# Patient Record
Sex: Female | Born: 1980 | Race: Black or African American | Hispanic: No | Marital: Single | Smoking: Never smoker
Health system: Southern US, Community
[De-identification: ages and names within clinical notes are randomized; demographics above are authoritative.]

## PROBLEM LIST (undated history)

## (undated) ENCOUNTER — Emergency Department (HOSPITAL_COMMUNITY): Payer: Medicaid Other

## (undated) DIAGNOSIS — M549 Dorsalgia, unspecified: Secondary | ICD-10-CM

## (undated) DIAGNOSIS — K219 Gastro-esophageal reflux disease without esophagitis: Secondary | ICD-10-CM

---

## 2008-08-15 ENCOUNTER — Ambulatory Visit: Payer: Self-pay | Admitting: Nurse Practitioner

## 2008-10-06 ENCOUNTER — Ambulatory Visit: Payer: Self-pay | Admitting: Internal Medicine

## 2009-03-23 ENCOUNTER — Ambulatory Visit: Payer: Self-pay | Admitting: Internal Medicine

## 2009-03-23 DIAGNOSIS — B9789 Other viral agents as the cause of diseases classified elsewhere: Secondary | ICD-10-CM | POA: Insufficient documentation

## 2009-07-11 ENCOUNTER — Ambulatory Visit: Payer: Self-pay | Admitting: Internal Medicine

## 2009-07-11 DIAGNOSIS — R5383 Other fatigue: Secondary | ICD-10-CM

## 2009-07-11 DIAGNOSIS — F411 Generalized anxiety disorder: Secondary | ICD-10-CM | POA: Insufficient documentation

## 2009-07-11 DIAGNOSIS — R5381 Other malaise: Secondary | ICD-10-CM

## 2009-07-11 DIAGNOSIS — G4700 Insomnia, unspecified: Secondary | ICD-10-CM

## 2009-07-13 LAB — CONVERTED CEMR LAB
Alkaline Phosphatase: 74 units/L (ref 39–117)
BUN: 8 mg/dL (ref 6–23)
Basophils Absolute: 0 10*3/uL (ref 0.0–0.1)
Basophils Relative: 1 % (ref 0–1)
CO2: 22 meq/L (ref 19–32)
Eosinophils Absolute: 0.1 10*3/uL (ref 0.0–0.7)
Eosinophils Relative: 1 % (ref 0–5)
Glucose, Bld: 83 mg/dL (ref 70–99)
HCT: 34.7 % — ABNORMAL LOW (ref 36.0–46.0)
Hemoglobin: 10.2 g/dL — ABNORMAL LOW (ref 12.0–15.0)
MCHC: 29.4 g/dL — ABNORMAL LOW (ref 30.0–36.0)
MCV: 71.5 fL — ABNORMAL LOW (ref 78.0–100.0)
Monocytes Absolute: 0.3 10*3/uL (ref 0.1–1.0)
Monocytes Relative: 7 % (ref 3–12)
Neutro Abs: 2 10*3/uL (ref 1.7–7.7)
RBC: 4.85 M/uL (ref 3.87–5.11)
RDW: 17.5 % — ABNORMAL HIGH (ref 11.5–15.5)
Sodium: 140 meq/L (ref 135–145)
TSH: 2.072 microintl units/mL (ref 0.350–4.500)
Total Bilirubin: 0.4 mg/dL (ref 0.3–1.2)
Total Protein: 7.1 g/dL (ref 6.0–8.3)

## 2009-07-16 ENCOUNTER — Encounter (INDEPENDENT_AMBULATORY_CARE_PROVIDER_SITE_OTHER): Payer: Self-pay | Admitting: Internal Medicine

## 2009-08-02 DIAGNOSIS — D509 Iron deficiency anemia, unspecified: Secondary | ICD-10-CM | POA: Insufficient documentation

## 2009-08-02 LAB — CONVERTED CEMR LAB
Iron: 20 ug/dL — ABNORMAL LOW (ref 42–145)
Saturation Ratios: 4 % — ABNORMAL LOW (ref 20–55)
UIBC: 449 ug/dL

## 2009-08-03 ENCOUNTER — Ambulatory Visit: Payer: Self-pay | Admitting: Internal Medicine

## 2009-08-03 DIAGNOSIS — L259 Unspecified contact dermatitis, unspecified cause: Secondary | ICD-10-CM | POA: Insufficient documentation

## 2009-09-20 ENCOUNTER — Ambulatory Visit: Payer: Self-pay | Admitting: Internal Medicine

## 2009-09-20 DIAGNOSIS — B86 Scabies: Secondary | ICD-10-CM | POA: Insufficient documentation

## 2010-05-29 ENCOUNTER — Ambulatory Visit: Payer: Self-pay | Admitting: Internal Medicine

## 2010-05-29 DIAGNOSIS — J309 Allergic rhinitis, unspecified: Secondary | ICD-10-CM | POA: Insufficient documentation

## 2010-08-26 ENCOUNTER — Ambulatory Visit: Payer: Self-pay | Admitting: Nurse Practitioner

## 2010-08-26 DIAGNOSIS — K029 Dental caries, unspecified: Secondary | ICD-10-CM | POA: Insufficient documentation

## 2011-01-07 NOTE — Assessment & Plan Note (Signed)
Summary: Acute - Allergic Rhinitis/Dental problems   Vital Signs:  Patient profile:   30 year old female LMP:     08/01/2010 Weight:      273.5 pounds BMI:     41.74 Pulse rate:   72 / minute Pulse rhythm:   regular Resp:     16 per minute BP sitting:   120 / 70  (left arm) Cuff size:   large  Vitals Entered By: Levon Hedger (August 26, 2010 4:18 PM) CC: pain in teeth with headache....back pain Is Patient Diabetic? No Pain Assessment Patient in pain? yes       Does patient need assistance? Functional Status Self care Ambulation Normal LMP (date): 08/01/2010     Enter LMP: 08/01/2010   CC:  pain in teeth with headache....back pain.  History of Present Illness:  Pt into the office with several complaints of pain. Appears to have vague pain symptoms during previous visits.  Tooth pain - left upper molar.  Pain started 3 day ago.  Last dental visit 1 year ago Admits to some swelling in jaw  She took tylenol otc which helps some with pain. Headache started prior to onset of dental problems - frontal  pain described as throbbing which is alleviated with tylenol -nausea or vomiting -no photophobia -blurred vision  Allergy meds prescribed in June 2011 and pt took for 2 weeks then she stopped because the symptoms improved.  Later when questioned about the medication pt reports that she has finished both.  Advised pt that she has refills and she should go to the refills as ordered.  Allergies (verified): No Known Drug Allergies  Review of Systems General:  Complains of fatigue, fever, and weakness. ENT:  Denies earache and sore throat. CV:  Denies chest pain or discomfort. Resp:  Denies cough. GI:  Denies abdominal pain, nausea, and vomiting. MS:  Complains of joint pain; bil knee pain. Neuro:  Complains of headaches.  Physical Exam  General:  alert.   Head:  normocephalic.   Ears:  bil l TM with clear fluid present no erythema Mouth:  left upper -  dental cavity Lungs:  normal breath sounds.   Heart:  normal rate and regular rhythm.   Abdomen:  normal bowel sounds.   Msk:  up to the exam table Neurologic:  alert & oriented X3.     Impression & Recommendations:  Problem # 1:  DENTAL CARIES (ICD-521.00) advised pt to make dental appt wil treat with pen v k  Problem # 2:  ALLERGIC RHINITIS (ICD-477.9) advised pt to take medications as ordered Her updated medication list for this problem includes:    Fluticasone Propionate 50 Mcg/act Susp (Fluticasone propionate) .Marland Kitchen... 2 sprays each nostril daily    Cetirizine Hcl 10 Mg Tabs (Cetirizine hcl) .Marland Kitchen... 1 tab by mouth daily for allergies  Complete Medication List: 1)  Ibuprofen 800 Mg Tabs (Ibuprofen) .... 1/2 to 1 tab by mouth every 6 hours as needed for headache, fever, body aches, sore throat 2)  Ferrous Sulfate 325 (65 Fe) Mg Tabs (Ferrous sulfate) .Marland Kitchen.. 1 tab by mouth two times a day 3)  Clobetasol Propionate 0.05 % Crea (Clobetasol propionate) .... Apply two times a day to affected areas prn 4)  Fluticasone Propionate 50 Mcg/act Susp (Fluticasone propionate) .... 2 sprays each nostril daily 5)  Cetirizine Hcl 10 Mg Tabs (Cetirizine hcl) .Marland Kitchen.. 1 tab by mouth daily for allergies 6)  Penicillin V Potassium 500 Mg Tabs (Penicillin v potassium) .Marland KitchenMarland KitchenMarland Kitchen  One tablet by mouth three times a day for infection  Patient Instructions: 1)  Allergies - this is likely the cause for headache. 2)  Take your allergy medications as ordered.  You have refills at the pharmacy 3)  Tooth pain - You to schedule an appointment with the dentist.  Take Penicillin three times a day WITH FOOD 4)  Follow up as needed with Dr. Delrae Alfred. Prescriptions: IBUPROFEN 800 MG TABS (IBUPROFEN) 1/2 to 1 tab by mouth every 6 hours as needed for headache, fever, body aches, sore throat  #50 x 1   Entered and Authorized by:   Lehman Prom FNP   Signed by:   Lehman Prom FNP on 08/26/2010   Method used:   Print then Mail  to Patient   RxID:   [id number] PENICILLIN V POTASSIUM 500 MG TABS (PENICILLIN V POTASSIUM) One tablet by mouth three times a day for infection  #30 x 0   Entered and Authorized by:   Lehman Prom FNP   Signed by:   Lehman Prom FNP on 08/26/2010   Method used:   Print then Mail to Patient   RxID:   [id number]

## 2011-01-07 NOTE — Assessment & Plan Note (Signed)
Summary: headache/ cough/gk   Vital Signs:  Patient profile:   30 year old female Weight:      272 pounds BMI:     41.51 Temp:     98.5 degrees F Pulse rate:   66 / minute Pulse rhythm:   regular Resp:     18 per minute BP sitting:   117 / 79  (left arm) Cuff size:   large  Vitals Entered By: Vesta Mixer CMA (May 29, 2010 11:04 AM) CC: cough, HA and hoarseness for about 4 weeks. Is Patient Diabetic? No Pain Assessment Patient in pain? no       Does patient need assistance? Ambulation Normal   CC:  cough and HA and hoarseness for about 4 weeks.Marland Kitchen  History of Present Illness:  1.  Started with a cough 4 weeks ago--comes and goes. Productive of white mucous  Gets hoarseness after coughing for a couple of days.  Has itchy throat.  Did have sneezing originally, but that is not bothering her now.  No nasal drainage.  Eyes intermittently itching and watering.  Does not not symptoms worsening when goes outside.  Has windows of home open to outside.  No bad taste from posterior pharyngeal drainage.  Having frontal headaches and pain around eyes.  Ears at sometimes feel stuffy.  Has not taken any meds otc.  Allergies (verified): No Known Drug Allergies  Physical Exam  Eyes:  No corneal or conjunctival inflammation noted. EOMI. Perrla. Funduscopic exam benign, without hemorrhages, exudates or papilledema. Vision grossly normal. Ears:  External ear exam shows no significant lesions or deformities.  Otoscopic examination reveals clear canals, tympanic membranes are intact bilaterally without bulging, retraction, inflammation or discharge. Hearing is grossly normal bilaterally. Nose:  nasal dischargemucosal pallor.   Mouth:  pharynx pink and moist.   Neck:  No deformities, masses, or tenderness noted. Lungs:  Normal respiratory effort, chest expands symmetrically. Lungs are clear to auscultation, no crackles or wheezes. Heart:  Normal rate and regular rhythm. S1 and S2 normal without  gallop, murmur, click, rub or other extra sounds.   Impression & Recommendations:  Problem # 1:  ALLERGIC RHINITIS WITH CONJUNCTIVITIS (ICD-477.9) Start meds--call if no improvement in 2 weeks Her updated medication list for this problem includes:    Fluticasone Propionate 50 Mcg/act Susp (Fluticasone propionate) .Marland Kitchen... 2 sprays each nostril daily    Cetirizine Hcl 10 Mg Tabs (Cetirizine hcl) .Marland Kitchen... 1 tab by mouth daily for allergies  Complete Medication List: 1)  Ibuprofen 800 Mg Tabs (Ibuprofen) .... 1/2 to 1 tab by mouth every 6 hours as needed for headache, fever, body aches, sore throat 2)  Ferrous Sulfate 325 (65 Fe) Mg Tabs (Ferrous sulfate) .Marland Kitchen.. 1 tab by mouth two times a day 3)  Clobetasol Propionate 0.05 % Crea (Clobetasol propionate) .... Apply two times a day to affected areas prn 4)  Fluticasone Propionate 50 Mcg/act Susp (Fluticasone propionate) .... 2 sprays each nostril daily 5)  Cetirizine Hcl 10 Mg Tabs (Cetirizine hcl) .Marland Kitchen.. 1 tab by mouth daily for allergies  Patient Instructions: 1)  Call in 2 weeks if not improved Prescriptions: CETIRIZINE HCL 10 MG TABS (CETIRIZINE HCL) 1 tab by mouth daily for allergies  #30 x 11   Entered and Authorized by:   Julieanne Manson MD   Signed by:   Julieanne Manson MD on 05/29/2010   Method used:   Electronically to        [facility] [phone]* (retail)  [address]       [place], [place]  [address]       Ph: [phone]       Fax: [phone]   RxID:   [id number] FLUTICASONE PROPIONATE 50 MCG/ACT SUSP (FLUTICASONE PROPIONATE) 2 sprays each nostril daily  #1 x 11   Entered and Authorized by:   Julieanne Manson MD   Signed by:   Julieanne Manson MD on 05/29/2010   Method used:   Electronically to        [facility] [phone]* (retail)       [address]       [place], [place]  [address]       Ph: [phone]       Fax: [phone]   RxID:   [id number]

## 2012-07-15 ENCOUNTER — Encounter (HOSPITAL_COMMUNITY): Payer: Self-pay | Admitting: Adult Health

## 2012-07-15 ENCOUNTER — Emergency Department (HOSPITAL_COMMUNITY)
Admission: EM | Admit: 2012-07-15 | Discharge: 2012-07-15 | Disposition: A | Discharge status: Home / Self Care | Payer: Medicaid Other | Attending: Emergency Medicine | Admitting: Emergency Medicine

## 2012-07-15 DIAGNOSIS — G245 Blepharospasm: Secondary | ICD-10-CM

## 2012-07-15 DIAGNOSIS — R259 Unspecified abnormal involuntary movements: Secondary | ICD-10-CM | POA: Insufficient documentation

## 2012-07-15 DIAGNOSIS — R51 Headache: Secondary | ICD-10-CM | POA: Insufficient documentation

## 2012-07-15 MED ORDER — ISOMETHEPTENE-APAP-DICHLORAL 65-325-100 MG PO CAPS: 1.0000 | ORAL_CAPSULE | Freq: Four times a day (QID) | ORAL | Status: DC | PRN

## 2012-07-15 MED ORDER — DIAZEPAM 5 MG PO TABS: 5.0000 mg | ORAL_TABLET | Freq: Four times a day (QID) | ORAL | Status: AC | PRN

## 2012-07-15 NOTE — ED Notes (Signed)
Pt reports she has a problem with her left lower eye, no swelling or drainage visible. Pt denies change in vision. Pt reports it has given her a headache and made her not feel well.

## 2012-07-15 NOTE — ED Notes (Signed)
C/o left eye twitching and pain that causes headache, began on Tuesday. No redness or edema, twitching noted.

## 2012-07-15 NOTE — ED Provider Notes (Signed)
History  Scribed for Celene Kras, MD, the patient was seen in room TR06C/TR06C. This chart was scribed by Candelaria Stagers. The patient's care started at 5:12 PM   CSN: [id number]  Arrival date & time 07/15/12  1553   First MD Initiated Contact with Patient 07/15/12 1702      Chief Complaint  Patient presents with  . Eye Pain     The history is provided by the patient.   Kathleen Fletcher is a 31 y.o. female who presents to the Emergency Department complaining of her left eye twitching which started three days ago.  Pt is also experiencing vomiting, nausea, and headache.  She reports that she has not seen her PCP for these sx.  Pt denies any caffeine intake lately or new stress.  She reports that she has never experienced these sx before.    History reviewed. No pertinent past medical history.  History reviewed. No pertinent past surgical history.  History reviewed. No pertinent family history.  History  Substance Use Topics  . Smoking status: Never Smoker   . Smokeless tobacco: Not on file  . Alcohol Use: No    OB History    Grav Para Term Preterm Abortions TAB SAB Ect Mult Living                  Review of Systems  Constitutional: Negative for fever.       10 Systems reviewed and are negative for acute change except as noted in the HPI.  HENT: Negative for congestion.   Eyes: Negative for discharge and redness.       Left eye twitching  Respiratory: Negative for cough and shortness of breath.   Cardiovascular: Negative for chest pain.  Gastrointestinal: Positive for nausea and vomiting. Negative for abdominal pain.  Musculoskeletal: Negative for back pain.  Skin: Negative for rash.  Neurological: Positive for headaches. Negative for syncope and numbness.  Psychiatric/Behavioral:       No behavior change.    Allergies  Review of patient's allergies indicates no known allergies.  Home Medications   Current Outpatient Rx  Name Route Sig Dispense Refill  .  IBUPROFEN 200 MG PO TABS Oral Take 400 mg by mouth every 6 (six) hours as needed. For headache      BP 119/70  Pulse 76  Temp 98.9 F (37.2 C) (Oral)  Resp 18  SpO2 100%  Physical Exam  Nursing note and vitals reviewed. Constitutional: She is oriented to person, place, and time. She appears well-developed and well-nourished. No distress.  HENT:  Head: Normocephalic and atraumatic.  Right Ear: External ear normal.  Left Ear: External ear normal.  Eyes: Conjunctivae are normal. Right eye exhibits no discharge. Left eye exhibits no discharge. No scleral icterus.       Recurrent blepharospasm left  Neck: Neck supple. No tracheal deviation present.  Cardiovascular: Normal rate, regular rhythm and intact distal pulses.   Pulmonary/Chest: Effort normal and breath sounds normal. No stridor. No respiratory distress. She has no wheezes. She has no rales.  Abdominal: Soft. Bowel sounds are normal. She exhibits no distension. There is no tenderness. There is no rebound and no guarding.  Musculoskeletal: She exhibits no edema and no tenderness.  Neurological: She is alert and oriented to person, place, and time. She has normal strength. No cranial nerve deficit ( no gross defecits noted) or sensory deficit. She exhibits normal muscle tone. She displays no seizure activity. Coordination and gait normal.  Skin: Skin  is warm and dry. No rash noted.  Psychiatric: She has a normal mood and affect.    ED Course  Procedures  DIAGNOSTIC STUDIES: Oxygen Saturation is 100% on room air, normal by my interpretation.    COORDINATION OF CARE:     Labs Reviewed - No data to display No results found.   1. Eye twitch   2. Headache       MDM  Patient does not have any evidence of neurologic dysfunction. She is having recurrent blepharospasm. She denies any recent stress or anxiety. She has not been taking excessive caffeinated beverages.  I will discharge her home on pain muscle relaxant and  migraine type medication. She is encouraged to follow up with her primary Dr. if the symptoms are not getting better I personally performed the services described in this documentation, which was scribed in my presence.  The recorded information has been reviewed and considered.         Celene Kras, MD 07/15/12 [phone]

## 2013-02-13 ENCOUNTER — Emergency Department (HOSPITAL_COMMUNITY)
Admission: EM | Admit: 2013-02-13 | Discharge: 2013-02-13 | Disposition: A | Discharge status: Home / Self Care | Payer: Medicaid Other | Attending: Emergency Medicine | Admitting: Emergency Medicine

## 2013-02-13 ENCOUNTER — Encounter (HOSPITAL_COMMUNITY): Payer: Self-pay | Admitting: Emergency Medicine

## 2013-02-13 DIAGNOSIS — R509 Fever, unspecified: Secondary | ICD-10-CM | POA: Insufficient documentation

## 2013-02-13 DIAGNOSIS — B9789 Other viral agents as the cause of diseases classified elsewhere: Secondary | ICD-10-CM | POA: Insufficient documentation

## 2013-02-13 DIAGNOSIS — Z3202 Encounter for pregnancy test, result negative: Secondary | ICD-10-CM | POA: Insufficient documentation

## 2013-02-13 DIAGNOSIS — R112 Nausea with vomiting, unspecified: Secondary | ICD-10-CM | POA: Insufficient documentation

## 2013-02-13 DIAGNOSIS — R1110 Vomiting, unspecified: Secondary | ICD-10-CM

## 2013-02-13 DIAGNOSIS — B349 Viral infection, unspecified: Secondary | ICD-10-CM

## 2013-02-13 DIAGNOSIS — IMO0001 Reserved for inherently not codable concepts without codable children: Secondary | ICD-10-CM | POA: Insufficient documentation

## 2013-02-13 LAB — POCT PREGNANCY, URINE: Preg Test, Ur: NEGATIVE

## 2013-02-13 LAB — CBC WITH DIFFERENTIAL/PLATELET
Basophils Relative: 0 % (ref 0–1)
Eosinophils Absolute: 0 10*3/uL (ref 0.0–0.7)
Eosinophils Relative: 0 % (ref 0–5)
Hemoglobin: 11.7 g/dL — ABNORMAL LOW (ref 12.0–15.0)
Lymphs Abs: 1 10*3/uL (ref 0.7–4.0)
MCH: 23 pg — ABNORMAL LOW (ref 26.0–34.0)
MCHC: 32.7 g/dL (ref 30.0–36.0)
MCV: 70.5 fL — ABNORMAL LOW (ref 78.0–100.0)
Monocytes Relative: 3 % (ref 3–12)
RBC: 5.08 MIL/uL (ref 3.87–5.11)

## 2013-02-13 LAB — COMPREHENSIVE METABOLIC PANEL
Albumin: 3.6 g/dL (ref 3.5–5.2)
Alkaline Phosphatase: 80 U/L (ref 39–117)
BUN: 9 mg/dL (ref 6–23)
Calcium: 9.3 mg/dL (ref 8.4–10.5)
GFR calc Af Amer: >90 mL/min (ref >90)
Glucose, Bld: 95 mg/dL (ref 70–99)
Potassium: 3.5 mEq/L (ref 3.5–5.1)
Sodium: 138 mEq/L (ref 135–145)
Total Protein: 7.6 g/dL (ref 6.0–8.3)

## 2013-02-13 LAB — URINALYSIS, ROUTINE W REFLEX MICROSCOPIC
Bilirubin Urine: NEGATIVE
Nitrite: NEGATIVE
Specific Gravity, Urine: 1.026 (ref 1.005–1.030)
Urobilinogen, UA: 0.2 mg/dL (ref 0.0–1.0)
pH: 6 (ref 5.0–8.0)

## 2013-02-13 LAB — LIPASE, BLOOD: Lipase: 26 U/L (ref 11–59)

## 2013-02-13 LAB — URINE MICROSCOPIC-ADD ON

## 2013-02-13 MED ORDER — ONDANSETRON HCL 4 MG PO TABS: 4.0000 mg | ORAL_TABLET | Freq: Four times a day (QID) | ORAL | Status: DC

## 2013-02-13 MED ORDER — KETOROLAC TROMETHAMINE 60 MG/2ML IM SOLN
60.0000 mg | Freq: Once | INTRAMUSCULAR | Status: AC
  Administered 2013-02-13: 60 mg via INTRAMUSCULAR
  Filled 2013-02-13: qty 2

## 2013-02-13 NOTE — ED Provider Notes (Signed)
History     CSN: [id number]  Arrival date & time 02/13/13  1426   First MD Initiated Contact with Patient 02/13/13 1726      Chief Complaint  Patient presents with  . Emesis    (Consider location/radiation/quality/duration/timing/severity/associated sxs/prior treatment) Patient is a 32 y.o. female presenting with general illness. The history is provided by the patient. No language interpreter was used.  Illness  The current episode started yesterday. The onset was gradual. The problem occurs continuously. The problem has been gradually worsening. The problem is moderate. Nothing relieves the symptoms. Nothing aggravates the symptoms. Associated symptoms include a fever, nausea, vomiting and muscle aches. Pertinent negatives include no photophobia, no abdominal pain, no diarrhea, no congestion, no headaches, no rhinorrhea, no sore throat, no neck pain, no neck stiffness, no cough, no URI, no rash and no eye discharge. The fever has been present for less than 1 day. Her temperature was unmeasured prior to arrival. The vomiting occurs intermittently. The emesis has an appearance of stomach contents. She has been less active. She has been drinking less than usual and eating less than usual. Urine output has been normal. The last void occurred less than 6 hours ago. There were sick contacts at home. She has received no recent medical care.    History reviewed. No pertinent past medical history.  History reviewed. No pertinent past surgical history.  History reviewed. No pertinent family history.  History  Substance Use Topics  . Smoking status: Never Smoker   . Smokeless tobacco: Not on file  . Alcohol Use: No    OB History   Grav Para Term Preterm Abortions TAB SAB Ect Mult Living                  Review of Systems  Constitutional: Positive for fever. Negative for chills, diaphoresis, activity change, appetite change and fatigue.  HENT: Negative for congestion, sore throat, facial  swelling, rhinorrhea, neck pain and neck stiffness.   Eyes: Negative for photophobia and discharge.  Respiratory: Negative for cough, chest tightness and shortness of breath.   Cardiovascular: Negative for chest pain, palpitations and leg swelling.  Gastrointestinal: Positive for nausea and vomiting. Negative for abdominal pain and diarrhea.  Endocrine: Negative for polydipsia and polyuria.  Genitourinary: Negative for dysuria, frequency, difficulty urinating and pelvic pain.  Musculoskeletal: Negative for back pain and arthralgias.  Skin: Negative for color change, rash and wound.  Allergic/Immunologic: Negative for immunocompromised state.  Neurological: Negative for facial asymmetry, weakness, numbness and headaches.  Hematological: Does not bruise/bleed easily.  Psychiatric/Behavioral: Negative for confusion and agitation.    Allergies  Review of patient's allergies indicates no known allergies.  Home Medications   Current Outpatient Rx  Name  Route  Sig  Dispense  Refill  . ondansetron (ZOFRAN) 4 MG tablet   Oral   Take 1 tablet (4 mg total) by mouth every 6 (six) hours.   12 tablet   0     BP 137/72  Pulse 75  Temp(Src) 98 F (36.7 C) (Oral)  Resp 18  SpO2 99%  Physical Exam  Constitutional: She is oriented to person, place, and time. She appears well-developed and well-nourished. No distress.  HENT:  Head: Normocephalic and atraumatic.  Mouth/Throat: No oropharyngeal exudate.  Eyes: Pupils are equal, round, and reactive to light.  Neck: Normal range of motion. Neck supple.  Cardiovascular: Normal rate, regular rhythm and normal heart sounds.  Exam reveals no gallop and no friction rub.  No murmur heard. Pulmonary/Chest: Effort normal and breath sounds normal. No respiratory distress. She has no wheezes. She has no rales.  Abdominal: Soft. Bowel sounds are normal. She exhibits no distension and no mass. There is no tenderness. There is no rebound and no guarding.   Musculoskeletal: Normal range of motion. She exhibits no edema and no tenderness.  Neurological: She is alert and oriented to person, place, and time. She has normal strength. No cranial nerve deficit or sensory deficit. She displays a negative Romberg sign. Coordination and gait normal. GCS eye subscore is 4. GCS verbal subscore is 5. GCS motor subscore is 6.  Skin: Skin is warm and dry.  Psychiatric: She has a normal mood and affect.    ED Course  Procedures (including critical care time)  Labs Reviewed  CBC WITH DIFFERENTIAL - Abnormal; Notable for the following:    Hemoglobin 11.7 (*)    HCT 35.8 (*)    MCV 70.5 (*)    MCH 23.0 (*)    Neutrophils Relative 82 (*)    All other components within normal limits  URINALYSIS, ROUTINE W REFLEX MICROSCOPIC - Abnormal; Notable for the following:    APPearance TURBID (*)    Protein, ur 30 (*)    Leukocytes, UA SMALL (*)    All other components within normal limits  URINE MICROSCOPIC-ADD ON - Abnormal; Notable for the following:    Squamous Epithelial / LPF MANY (*)    Bacteria, UA FEW (*)    All other components within normal limits  COMPREHENSIVE METABOLIC PANEL  LIPASE, BLOOD  POCT PREGNANCY, URINE   No results found.   1. Viral syndrome   2. Vomiting       MDM   Pt is a 32 y.o. female with no pertinent PMHX who presents with one day of chills, myalgias, headache, nausea, vomiting, in setting of two children with similar symptoms this morning. On exam, VSS, afebrile in NAD.  Neuro exam, cardiopulm exam & abdominal exam benign.   Urine not infected, CBC, CMP unremarkable.  Doubt meningitis, pna, UTI, or acute surgical cause of pain.  I feel pt likely has acute viral syndrome or early gastroenteritis.  Pt felt improved after PO ibuprofen.  Will d/c home w/ rx for zofran and recommendations for PO hydration.  Return precautions given for new or worsening symptoms.   1. Viral syndrome   2. Vomiting      Labs and imaging  considered in decision making, reviewed by myself.  Imaging interpreted by radiology. Pt care discussed with my attending, Dr. Bebe Shaggy.         Toy Cookey, MD 02/13/13 Ernest[facility] [place]

## 2013-02-13 NOTE — ED Notes (Signed)
Pt c/o vomiting starting today; pt sts HA and generalized body aches also

## 2013-02-15 NOTE — ED Provider Notes (Signed)
I have personally seen and examined the patient.  I have discussed the plan of care with the resident.  I have reviewed the documentation on PMH/FH/Soc. History.  I have reviewed the documentation of the resident and agree. - except no imaging performed Pt well appearing, no distress.  Denies known exposure to CO poisoning (family had similar symptoms) Suspect viral process  Joya Gaskins, MD 02/15/13 1036

## 2018-05-18 ENCOUNTER — Emergency Department (HOSPITAL_COMMUNITY): Payer: BLUE CROSS/BLUE SHIELD

## 2018-05-18 ENCOUNTER — Encounter (HOSPITAL_COMMUNITY): Payer: Self-pay | Admitting: Emergency Medicine

## 2018-05-18 ENCOUNTER — Emergency Department (HOSPITAL_COMMUNITY)
Admission: EM | Admit: 2018-05-18 | Discharge: 2018-05-18 | Disposition: A | Discharge status: Home / Self Care | Payer: BLUE CROSS/BLUE SHIELD | Attending: Emergency Medicine | Admitting: Emergency Medicine

## 2018-05-18 DIAGNOSIS — R51 Headache: Secondary | ICD-10-CM | POA: Insufficient documentation

## 2018-05-18 DIAGNOSIS — M25511 Pain in right shoulder: Secondary | ICD-10-CM | POA: Insufficient documentation

## 2018-05-18 DIAGNOSIS — M25561 Pain in right knee: Secondary | ICD-10-CM | POA: Insufficient documentation

## 2018-05-18 MED ORDER — CYCLOBENZAPRINE HCL 10 MG PO TABS: 10.0000 mg | ORAL_TABLET | Freq: Two times a day (BID) | ORAL | 0 refills | Status: DC | PRN

## 2018-05-18 MED ORDER — NAPROXEN 500 MG PO TABS: 500.0000 mg | ORAL_TABLET | Freq: Two times a day (BID) | ORAL | 0 refills | Status: DC
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
## 2018-05-18 NOTE — ED Notes (Signed)
Patient transported to X-ray 
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
## 2019-06-13 ENCOUNTER — Other Ambulatory Visit: Payer: Self-pay

## 2019-06-13 ENCOUNTER — Encounter (HOSPITAL_COMMUNITY): Payer: Self-pay | Admitting: Emergency Medicine

## 2019-06-13 ENCOUNTER — Emergency Department (HOSPITAL_COMMUNITY)
Admission: EM | Admit: 2019-06-13 | Discharge: 2019-06-13 | Disposition: A | Discharge status: Home / Self Care | Payer: 59 | Attending: Emergency Medicine | Admitting: Emergency Medicine

## 2019-06-13 DIAGNOSIS — Z79899 Other long term (current) drug therapy: Secondary | ICD-10-CM | POA: Diagnosis not present

## 2019-06-13 DIAGNOSIS — R21 Rash and other nonspecific skin eruption: Secondary | ICD-10-CM | POA: Diagnosis present

## 2019-06-13 MED ORDER — DIPHENHYDRAMINE HCL 25 MG PO CAPS
25.0000 mg | ORAL_CAPSULE | Freq: Once | ORAL | Status: AC
  Administered 2019-06-13: 05:00:00 25 mg via ORAL
  Filled 2019-06-13: qty 1

## 2019-06-13 MED ORDER — FAMOTIDINE 20 MG PO TABS
20.0000 mg | ORAL_TABLET | Freq: Once | ORAL | Status: AC
  Administered 2019-06-13: 05:00:00 20 mg via ORAL
  Filled 2019-06-13: qty 1

## 2019-06-13 MED ORDER — PREDNISONE 20 MG PO TABS: 40.0000 mg | ORAL_TABLET | Freq: Every day | ORAL | 0 refills | Status: AC

## 2019-06-13 MED ORDER — FAMOTIDINE 20 MG PO TABS: 20.0000 mg | ORAL_TABLET | Freq: Two times a day (BID) | ORAL | 0 refills | Status: AC

## 2019-06-13 MED ORDER — DIPHENHYDRAMINE HCL 25 MG PO TABS: 25.0000 mg | ORAL_TABLET | Freq: Two times a day (BID) | ORAL | 0 refills | Status: AC

## 2019-06-13 MED ORDER — METHYLPREDNISOLONE SODIUM SUCC 125 MG IJ SOLR
80.0000 mg | Freq: Once | INTRAMUSCULAR | Status: AC
  Administered 2019-06-13: 80 mg via INTRAMUSCULAR
  Filled 2019-06-13: qty 2

## 2019-06-13 NOTE — ED Triage Notes (Signed)
Pt c/o rash ? Allergic reaction, bilat arms, neck and upper back. Pt reports eating fish and chicken yesterday even though she has been told she may have an allergy to fish. Pt denies airway involvement.  Pt denies taking any benadryl.

## 2019-06-13 NOTE — ED Provider Notes (Signed)
[facility] EMERGENCY DEPARTMENT Provider Note   CSN: [id number] Arrival date & time: 06/13/19  0149    History   Chief Complaint Chief Complaint  Patient presents with   Rash    HPI Kathleen Fletcher is a 38 y.o. female with PMH/o contact dermatitis, scabies, anxiety, who presents for evaluation of rash that began this morning. Patient reports pruritic rash noted to BUE and chest that began this morning. She states she has not taken any medications for symptoms. She states she has had a similar rash before when she ate fish. She reports she ate fish last night. She does not have any other allergies. She denies any new meds, lotions, soaps, detergents, exposures. She denies any fevers, SOB, vomiting, swelling of her lips or tongues, CP.      The history is provided by the patient.    History reviewed. No pertinent past medical history.  Patient Active Problem List   Diagnosis Date Noted   DENTAL CARIES 08/26/2010   Allergic rhinitis, cause unspecified 05/29/2010   SCABIES 09/20/2009   CONTACT DERMATITIS 08/03/2009   ANEMIA, IRON DEFICIENCY 08/02/2009   ANXIETY 07/11/2009   INSOMNIA 07/11/2009   FATIGUE 07/11/2009   VIRAL INFECTION, ACUTE 03/23/2009   TB SKIN TEST, POSITIVE 08/15/2008    History reviewed. No pertinent surgical history.   OB History   No obstetric history on file.      Home Medications    Prior to Admission medications   Medication Sig Start Date End Date Taking? Authorizing Provider  cyclobenzaprine (FLEXERIL) 10 MG tablet Take 1 tablet (10 mg total) by mouth 2 (two) times daily as needed for muscle spasms. 05/18/18   Khatri, Hina, PA-C  diphenhydrAMINE (BENADRYL) 25 MG tablet Take 1 tablet (25 mg total) by mouth every 12 (twelve) hours for 3 days. 06/13/19 06/16/19  Volanda Napoleon, PA-C  famotidine (PEPCID) 20 MG tablet Take 1 tablet (20 mg total) by mouth 2 (two) times daily for 3 days. 06/13/19 06/16/19  Volanda Napoleon,  PA-C  naproxen (NAPROSYN) 500 MG tablet Take 1 tablet (500 mg total) by mouth 2 (two) times daily. 05/18/18   Khatri, Hina, PA-C  ondansetron (ZOFRAN) 4 MG tablet Take 1 tablet (4 mg total) by mouth every 6 (six) hours. Patient not taking: Reported on 05/18/2018 02/13/13   Ernestina Patches, MD  predniSONE (DELTASONE) 20 MG tablet Take 2 tablets (40 mg total) by mouth daily for 4 days. 06/13/19 06/17/19  Volanda Napoleon, PA-C    Family History No family history on file.  Social History Social History   Tobacco Use   Smoking status: Never Smoker   Smokeless tobacco: Never Used  Substance Use Topics   Alcohol use: No   Drug use: No     Allergies   Patient has no known allergies.   Review of Systems Review of Systems  HENT: Negative for facial swelling and trouble swallowing.   Respiratory: Negative for shortness of breath.   Cardiovascular: Negative for chest pain.  Skin: Positive for rash.  All other systems reviewed and are negative.    Physical Exam Updated Vital Signs BP 102/89    Pulse (!) 58    Temp 98.1 F (36.7 C) (Oral)    Resp 18    Ht 5\' 8"  (1.727 m)    LMP 06/08/2019    SpO2 100%    BMI 41.59 kg/m   Physical Exam Vitals signs and nursing note reviewed.  Constitutional:  Appearance: She is well-developed.  HENT:     Head: Normocephalic and atraumatic.     Mouth/Throat:     Comments: Airways patent, phonation is intact.  No oral angioedema.  No facial swelling. Eyes:     General: No scleral icterus.       Right eye: No discharge.        Left eye: No discharge.     Conjunctiva/sclera: Conjunctivae normal.  Pulmonary:     Effort: Pulmonary effort is normal.     Comments: Lungs clear to auscultation bilaterally.  Symmetric chest rise.  No wheezing, rales, rhonchi. Skin:    General: Skin is warm and dry.     Findings: Rash present.     Comments: Maculopapular rash noted to anterior chest wall, bilateral upper extremities and the upper part of her  back.  There is some overlying urticaria noted.  No rash noted to palms or soles of feet.  No desquamation of mucous membranes.  No oral lesions.   Neurological:     Mental Status: She is alert.  Psychiatric:        Speech: Speech normal.        Behavior: Behavior normal.      ED Treatments / Results  Labs (all labs ordered are listed, but only abnormal results are displayed) Labs Reviewed - No data to display  EKG None  Radiology No results found.  Procedures Procedures (including critical care time)  Medications Ordered in ED Medications  diphenhydrAMINE (BENADRYL) capsule 25 mg (25 mg Oral Given 06/13/19 0447)  famotidine (PEPCID) tablet 20 mg (20 mg Oral Given 06/13/19 0447)  methylPREDNISolone sodium succinate (SOLU-MEDROL) 125 mg/2 mL injection 80 mg (80 mg Intramuscular Given 06/13/19 0448)     Initial Impression / Assessment and Plan / ED Course  I have reviewed the triage vital signs and the nursing notes.  Pertinent labs & imaging results that were available during my care of the patient were reviewed by me and considered in my medical decision making (see chart for details).        38 year old female who presents for evaluation of rash that began today.  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  She states last time she ate fish, she broke out into a similar rash.  She reports that she ate fish last night.  No swelling of her tongue or lips.  No trouble breathing.  No chest pain.  She is not taking any medications for symptoms. Patient is afebrile, non-toxic appearing, sitting comfortably on examination table. Vital signs reviewed and stable.  On exam, she has a maculopapular rash noted to anterior chest wall, bilateral upper extremities and back.  There is scattered urticaria throughout.  No oral angioedema.  Lungs clear to auscultation.  Consider contact dermatitis versus allergic reaction.  History/physical exam not concerning for SJS, TENS, scabies, shingles.  Plan for  medications and re-eval.  Reevaluation after medications.  Rash is improved.  Still slightly present but has significantly improved.  Patient reports feeling better.  She has been able to tolerate p.o. without any difficulty.  Vitals are stable.  Patient states she is comfortable and ready to go home. At this time, patient exhibits no emergent life-threatening condition that require further evaluation in ED or admission.  Plan for medications to help with symptomatic relief. At this time, patient exhibits no emergent life-threatening condition that require further evaluation in ED or admission. Patient had ample opportunity for questions and discussion. All patient's questions were answered  with full understanding. Strict return precautions discussed. Patient expresses understanding and agreement to plan.   Portions of this note were generated with Scientist, clinical (histocompatibility and immunogenetics)Dragon dictation software. Dictation errors may occur despite best attempts at proofreading.   Final Clinical Impressions(s) / ED Diagnoses   Final diagnoses:  Rash    ED Discharge Orders         Ordered    diphenhydrAMINE (BENADRYL) 25 MG tablet  Every 12 hours     06/13/19 0631    predniSONE (DELTASONE) 20 MG tablet  Daily     06/13/19 0631    famotidine (PEPCID) 20 MG tablet  2 times daily     06/13/19 0631           Maxwell CaulLayden, Mayci Haning A, PA-C 06/13/19 [id number]    Nira Connardama, Pedro Eduardo, MD 06/13/19 [phone]

## 2019-06-13 NOTE — Discharge Instructions (Signed)
Take Benadryl, Pepcid, prednisone as directed.  Do not eat any more fish.  Follow-up with [facility] to establish a primary care doctor if you do not have one.   Return to the Emergency Department for any worsening rash, trouble breathing, swelling of your lips or tongue or any other worsening or concerning symptoms.

## 2021-03-04 ENCOUNTER — Encounter (HOSPITAL_COMMUNITY): Payer: Self-pay | Admitting: Emergency Medicine

## 2021-03-04 ENCOUNTER — Other Ambulatory Visit: Payer: Self-pay

## 2021-03-04 ENCOUNTER — Emergency Department (HOSPITAL_COMMUNITY)
Admission: EM | Admit: 2021-03-04 | Discharge: 2021-03-05 | Disposition: A | Discharge status: Home / Self Care | Payer: 59 | Attending: Emergency Medicine | Admitting: Emergency Medicine

## 2021-03-04 ENCOUNTER — Emergency Department (HOSPITAL_COMMUNITY): Payer: 59

## 2021-03-04 DIAGNOSIS — R519 Headache, unspecified: Secondary | ICD-10-CM | POA: Insufficient documentation

## 2021-03-04 DIAGNOSIS — K219 Gastro-esophageal reflux disease without esophagitis: Secondary | ICD-10-CM | POA: Insufficient documentation

## 2021-03-04 DIAGNOSIS — D500 Iron deficiency anemia secondary to blood loss (chronic): Secondary | ICD-10-CM | POA: Insufficient documentation

## 2021-03-04 DIAGNOSIS — R079 Chest pain, unspecified: Secondary | ICD-10-CM | POA: Diagnosis present

## 2021-03-04 HISTORY — DX: Gastro-esophageal reflux disease without esophagitis: K21.9

## 2021-03-04 HISTORY — DX: Dorsalgia, unspecified: M54.9

## 2021-03-04 LAB — BASIC METABOLIC PANEL
Anion gap: 5 (ref 5–15)
BUN: 11 mg/dL (ref 6–20)
CO2: 27 mmol/L (ref 22–32)
Calcium: 9.3 mg/dL (ref 8.9–10.3)
Chloride: 105 mmol/L (ref 98–111)
Creatinine, Ser: 0.94 mg/dL (ref 0.44–1.00)
GFR, Estimated: >60 mL/min (ref >60)
Glucose, Bld: 93 mg/dL (ref 70–99)
Potassium: 3.9 mmol/L (ref 3.5–5.1)
Sodium: 137 mmol/L (ref 135–145)

## 2021-03-04 LAB — CBC
HCT: 34.3 % — ABNORMAL LOW (ref 36.0–46.0)
Hemoglobin: 10.2 g/dL — ABNORMAL LOW (ref 12.0–15.0)
MCH: 21.1 pg — ABNORMAL LOW (ref 26.0–34.0)
MCHC: 29.7 g/dL — ABNORMAL LOW (ref 30.0–36.0)
MCV: 70.9 fL — ABNORMAL LOW (ref 80.0–100.0)
Platelets: 483 10*3/uL — ABNORMAL HIGH (ref 150–400)
RBC: 4.84 MIL/uL (ref 3.87–5.11)
RDW: 17.2 % — ABNORMAL HIGH (ref 11.5–15.5)
WBC: 6.7 10*3/uL (ref 4.0–10.5)
nRBC: 0 % (ref 0.0–0.2)

## 2021-03-04 LAB — I-STAT BETA HCG BLOOD, ED (MC, WL, AP ONLY): I-stat hCG, quantitative: <5 m[IU]/mL (ref <5)

## 2021-03-04 LAB — TROPONIN I (HIGH SENSITIVITY)
Troponin I (High Sensitivity): 3 ng/L (ref <18)
Troponin I (High Sensitivity): 2 ng/L (ref <18)

## 2021-03-04 NOTE — ED Triage Notes (Signed)
Pt. Stated, I have some chest pain and I take medicine for that (acid reflux), stomach pain, back pain and feeling dizzy for the last 3 weeks.

## 2021-03-05 ENCOUNTER — Other Ambulatory Visit: Payer: Self-pay

## 2021-03-05 MED ORDER — ALUM & MAG HYDROXIDE-SIMETH 200-200-20 MG/5ML PO SUSP
30.0000 mL | Freq: Once | ORAL | Status: AC
  Administered 2021-03-05: 30 mL via ORAL
  Filled 2021-03-05: qty 30

## 2021-03-05 MED ORDER — LIDOCAINE VISCOUS HCL 2 % MT SOLN
15.0000 mL | Freq: Once | OROMUCOSAL | Status: AC
  Administered 2021-03-05: 15 mL via ORAL
  Filled 2021-03-05: qty 15

## 2021-03-05 MED ORDER — PANTOPRAZOLE SODIUM 20 MG PO TBEC: 20.0000 mg | DELAYED_RELEASE_TABLET | Freq: Every day | ORAL | 0 refills | Status: AC

## 2021-03-05 NOTE — ED Notes (Signed)
Patient verbalizes understanding of discharge instructions. Prescriptions and follow-up care reviewed. Opportunity for questioning and answers were provided. Armband removed by staff, pt discharged from ED ambulatory.  

## 2021-03-05 NOTE — Discharge Instructions (Addendum)
Thank you for allowing me to care for you today in the Emergency Department.   You were seen today for chest and abdominal pain.  Your symptoms were most consistent with GERD.  You were treated with a GI cocktail and your symptoms resolved.  To treat your symptoms at home, you can try avoiding spicy food and following the diet plan attached to help prevent your symptoms.  Try taking 1 tablet of Protonix daily to prevent your symptoms.  You can take 1 tablet daily for the next 2 weeks to see if this will help.  However, if you continue to have symptoms despite taking Protonix, you can take Tums as described on the label.  Maalox is also available over-the-counter and can be taken as directed on the label for your symptoms.  Please call to schedule follow-up appointment with your primary care provider.  Return to the emergency department if you have black or bloody stool, high shortness of breath, if you pass out, if you start having chest pain that is worse with exertion, worsening symptoms with a temperature greater than 100.4, or other new, concerning symptoms.

## 2021-03-05 NOTE — ED Provider Notes (Signed)
[facility] Provider Note   CSN: [id number] Arrival date & time: 03/04/21  1657     History Chief Complaint  Patient presents with  . Dizziness  . Chest Pain  . Abdominal Pain  . Back Pain    Kathleen Fletcher is a 40 y.o. female with a history of allergic rhinitis and iron deficiency anemia who presents to the emergency department who presents to the emergency department with a chief complaint of chest pain.  The patient endorses burning pain located in the middle of her chest that radiates up into her throat and down into her upper abdomen.  Abdominal pain does not radiate to her back.  Pain is worse after eating.  She reports associated burping and belching.  She reports that her diet consist of many spicy foods.  She is also noted to have a Barby Colvard's bag at bedside.  She denies nausea, vomiting, diarrhea, constipation, dysuria, vaginal bleeding, vaginal discharge, cough, shortness of breath, back pain, dizziness, lightheadedness, visual changes, numbness, weakness, neck pain, arm pain, leg pain or swelling.  Reports that she has a history of similar pain and was taking Tums, but reports that this is no longer helping her symptoms.  States that the pain feels similar to previous exacerbations of acid reflux.  No melena or hematochezia.  No unintended weight loss.  She also reports that she has been having intermittent headaches.  She has no headache at this time.  Reports that she has taken over-the-counter medication and that will relieve her headaches.  She has not been drinking fluids as well she should have been.  She denies any decreased urinary output.  No neck pain or stiffness, fever or chills.   The history is provided by the patient and medical records. No language interpreter was used.       Past Medical History:  Diagnosis Date  . Acid reflux   . Back pain     Patient Active Problem List   Diagnosis Date Noted  . DENTAL CARIES  08/26/2010  . Allergic rhinitis, cause unspecified 05/29/2010  . SCABIES 09/20/2009  . CONTACT DERMATITIS 08/03/2009  . ANEMIA, IRON DEFICIENCY 08/02/2009  . ANXIETY 07/11/2009  . INSOMNIA 07/11/2009  . FATIGUE 07/11/2009  . VIRAL INFECTION, ACUTE 03/23/2009  . TB SKIN TEST, POSITIVE 08/15/2008    History reviewed. No pertinent surgical history.   OB History   No obstetric history on file.     No family history on file.  Social History   Tobacco Use  . Smoking status: Never Smoker  . Smokeless tobacco: Never Used  Substance Use Topics  . Alcohol use: No  . Drug use: No    Home Medications Prior to Admission medications   Medication Sig Start Date End Date Taking? Authorizing Provider  pantoprazole (PROTONIX) 20 MG tablet Take 1 tablet (20 mg total) by mouth daily for 14 days. 03/05/21 03/19/21 Yes Sondos Wolfman A, PA-C  cyclobenzaprine (FLEXERIL) 10 MG tablet Take 1 tablet (10 mg total) by mouth 2 (two) times daily as needed for muscle spasms. 05/18/18   Khatri, Hina, PA-C  diphenhydrAMINE (BENADRYL) 25 MG tablet Take 1 tablet (25 mg total) by mouth every 12 (twelve) hours for 3 days. 06/13/19 06/16/19  Maxwell CaulLayden, Lindsey A, PA-C  famotidine (PEPCID) 20 MG tablet Take 1 tablet (20 mg total) by mouth 2 (two) times daily for 3 days. 06/13/19 06/16/19  Maxwell CaulLayden, Lindsey A, PA-C  naproxen (NAPROSYN) 500 MG tablet Take 1 tablet (  500 mg total) by mouth 2 (two) times daily. 05/18/18   Dietrich Pates, PA-C    Allergies    Patient has no known allergies.  Review of Systems   Review of Systems  Constitutional: Negative for activity change and fever.  HENT: Negative for congestion and sore throat.   Eyes: Negative for visual disturbance.  Respiratory: Negative for cough, shortness of breath and wheezing.   Cardiovascular: Positive for chest pain. Negative for palpitations and leg swelling.  Gastrointestinal: Positive for abdominal pain. Negative for constipation, diarrhea, nausea and vomiting.   Genitourinary: Negative for dysuria.  Musculoskeletal: Negative for back pain, myalgias, neck pain and neck stiffness.  Skin: Negative for rash.  Allergic/Immunologic: Negative for immunocompromised state.  Neurological: Positive for headaches. Negative for seizures, syncope and weakness.  Psychiatric/Behavioral: Negative for confusion.    Physical Exam Updated Vital Signs BP (!) 143/84   Pulse 62   Temp 98.6 F (37 C) (Oral)   Resp 17   LMP 02/14/2021   SpO2 99%   Physical Exam Vitals and nursing note reviewed.  Constitutional:      General: She is not in acute distress.    Appearance: She is obese. She is not ill-appearing, toxic-appearing or diaphoretic.  HENT:     Head: Normocephalic.     Mouth/Throat:     Mouth: Mucous membranes are moist.     Pharynx: No oropharyngeal exudate or posterior oropharyngeal erythema.  Eyes:     Conjunctiva/sclera: Conjunctivae normal.  Cardiovascular:     Rate and Rhythm: Normal rate and regular rhythm.     Pulses: Normal pulses.     Heart sounds: Normal heart sounds. No murmur heard. No friction rub. No gallop.   Pulmonary:     Effort: Pulmonary effort is normal. No respiratory distress.     Breath sounds: No stridor. No wheezing, rhonchi or rales.  Chest:     Chest wall: No tenderness.  Abdominal:     General: There is no distension.     Palpations: Abdomen is soft. There is no mass.     Tenderness: There is abdominal tenderness. There is no right CVA tenderness, left CVA tenderness, guarding or rebound.     Hernia: No hernia is present.     Comments: Minimal tenderness to palpation in the epigastric region.  No rebound or guarding.  Normoactive bowel sounds.  Negative Murphy sign.  No CVA tenderness.  No tenderness over McBurney's point.  Musculoskeletal:     Cervical back: Neck supple.  Skin:    General: Skin is warm.     Findings: No rash.  Neurological:     Mental Status: She is alert.  Psychiatric:        Behavior:  Behavior normal.     ED Results / Procedures / Treatments   Labs (all labs ordered are listed, but only abnormal results are displayed) Labs Reviewed  CBC - Abnormal; Notable for the following components:      Result Value   Hemoglobin 10.2 (*)    HCT 34.3 (*)    MCV 70.9 (*)    MCH 21.1 (*)    MCHC 29.7 (*)    RDW 17.2 (*)    Platelets 483 (*)    All other components within normal limits  BASIC METABOLIC PANEL  I-STAT BETA HCG BLOOD, ED ([facility], [facility], [facility] ONLY)  TROPONIN I (HIGH SENSITIVITY)  TROPONIN I (HIGH SENSITIVITY)    EKG EKG Interpretation  Date/Time:  Monday March 04 2021 17:19:35  EDT Ventricular Rate:  75 PR Interval:  128 QRS Duration: 70 QT Interval:  378 QTC Calculation: 422 R Axis:   27 Text Interpretation: Normal sinus rhythm Normal ECG No old tracing to compare Confirmed by Dione Booze ([id number]) on 03/05/2021 2:09:26 AM   Radiology DG Chest 2 View  Result Date: 03/04/2021 CLINICAL DATA:  Chest pain for several weeks EXAM: CHEST - 2 VIEW COMPARISON:  02/01/2008 FINDINGS: Cardiac shadow is at the upper limits of normal in size. The lungs are clear bilaterally. No focal infiltrate or effusion is noted. No bony abnormality is seen. IMPRESSION: No acute abnormality noted. Electronically Signed   By: Alcide Clever M.D.   On: 03/04/2021 20:36    Procedures Procedures   Medications Ordered in ED Medications  alum & mag hydroxide-simeth (MAALOX/MYLANTA) 200-200-20 MG/5ML suspension 30 mL (30 mLs Oral Given 03/05/21 0422)    And  lidocaine (XYLOCAINE) 2 % viscous mouth solution 15 mL (15 mLs Oral Given 03/05/21 0422)    ED Course  I have reviewed the triage vital signs and the nursing notes.  Pertinent labs & imaging results that were available during my care of the patient were reviewed by me and considered in my medical decision making (see chart for details).    MDM Rules/Calculators/A&P                          40 year old female with a history of allergic  rhinitis and iron deficiency anemia who presents the emergency department with 3-week history of burning chest pain that radiates into the abdomen that is intermittent and worse after eating.  States that it feels similar to previous episodes of heartburn.  She does also report that she been having intermittent headaches that resolved with over-the-counter medication.  No headache at this time.  No red flags pertaining to her headache.  Vital signs are stable.  Labs and imaging have been reviewed and independently interpreted by me.  Troponin levels ordered by triage.  Delta troponin is not elevated.  EKG with normal sinus rhythm.  Symptoms are more consistent with GERD and very atypical for ACS.  She does have a microcytic anemia with a hemoglobin of 10.2 that is stable from previous.  I have a low suspicion for GI bleed as patient does report heavy menstrual cycles.  Suspect this is chronic iron deficiency anemia from heavy menstrual cycles.  Pregnancy test is negative. Chest x-ray is negative.  Suspect GERD.  Doubt ruptured peptic ulcer, pancreatitis, cholecystitis, choledocholithiasis, pyelonephritis, PID, PE, aortic dissection, esophageal rupture, mesenteric ischemia.  Patient was treated with a GI cocktail and on reevaluation symptoms have resolved.  She reports that she is feeling much better and is ready for discharge.  Will discharge patient with a 2-week course of Protonix and recommended following up with her PCP for reevaluation.  All questions answered.  She is hemodynamically stable no acute distress.  Safe for discharge home with outpatient follow-up as indicated.  Final Clinical Impression(s) / ED Diagnoses Final diagnoses:  Gastroesophageal reflux disease, unspecified whether esophagitis present  Iron deficiency anemia due to chronic blood loss    Rx / DC Orders ED Discharge Orders         Ordered    pantoprazole (PROTONIX) 20 MG tablet  Daily        03/05/21 0513            Barkley Boards, PA-C 03/05/21 0525    Dione Booze,  MD 03/05/21 [phone]

## 2021-10-14 ENCOUNTER — Other Ambulatory Visit: Payer: Self-pay

## 2021-10-14 ENCOUNTER — Emergency Department (HOSPITAL_COMMUNITY): Payer: 59

## 2021-10-14 ENCOUNTER — Emergency Department (HOSPITAL_COMMUNITY)
Admission: EM | Admit: 2021-10-14 | Discharge: 2021-10-15 | Disposition: A | Discharge status: Home / Self Care | Payer: 59 | Attending: Emergency Medicine | Admitting: Emergency Medicine

## 2021-10-14 ENCOUNTER — Encounter (HOSPITAL_COMMUNITY): Payer: Self-pay | Admitting: Emergency Medicine

## 2021-10-14 DIAGNOSIS — M25571 Pain in right ankle and joints of right foot: Secondary | ICD-10-CM | POA: Diagnosis not present

## 2021-10-14 DIAGNOSIS — M545 Low back pain, unspecified: Secondary | ICD-10-CM | POA: Insufficient documentation

## 2021-10-14 DIAGNOSIS — Y92410 Unspecified street and highway as the place of occurrence of the external cause: Secondary | ICD-10-CM | POA: Insufficient documentation
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
## 2021-10-15 ENCOUNTER — Encounter (HOSPITAL_COMMUNITY): Payer: Self-pay | Admitting: Student

## 2021-10-15 MED ORDER — NAPROXEN 500 MG PO TABS: 500.0000 mg | ORAL_TABLET | Freq: Two times a day (BID) | ORAL | 0 refills | Status: AC | PRN

## 2021-10-15 MED ORDER — METHOCARBAMOL 500 MG PO TABS: 500.0000 mg | ORAL_TABLET | Freq: Three times a day (TID) | ORAL | 0 refills | Status: AC | PRN
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
## 2022-03-17 IMAGING — CR DG CHEST 2V
2 series · 2 of 2 positions shown · non-contrast
Comparison: 02/01/2008

CLINICAL DATA: Chest pain for several weeks

EXAM:
CHEST - 2 VIEW

[chest pa]
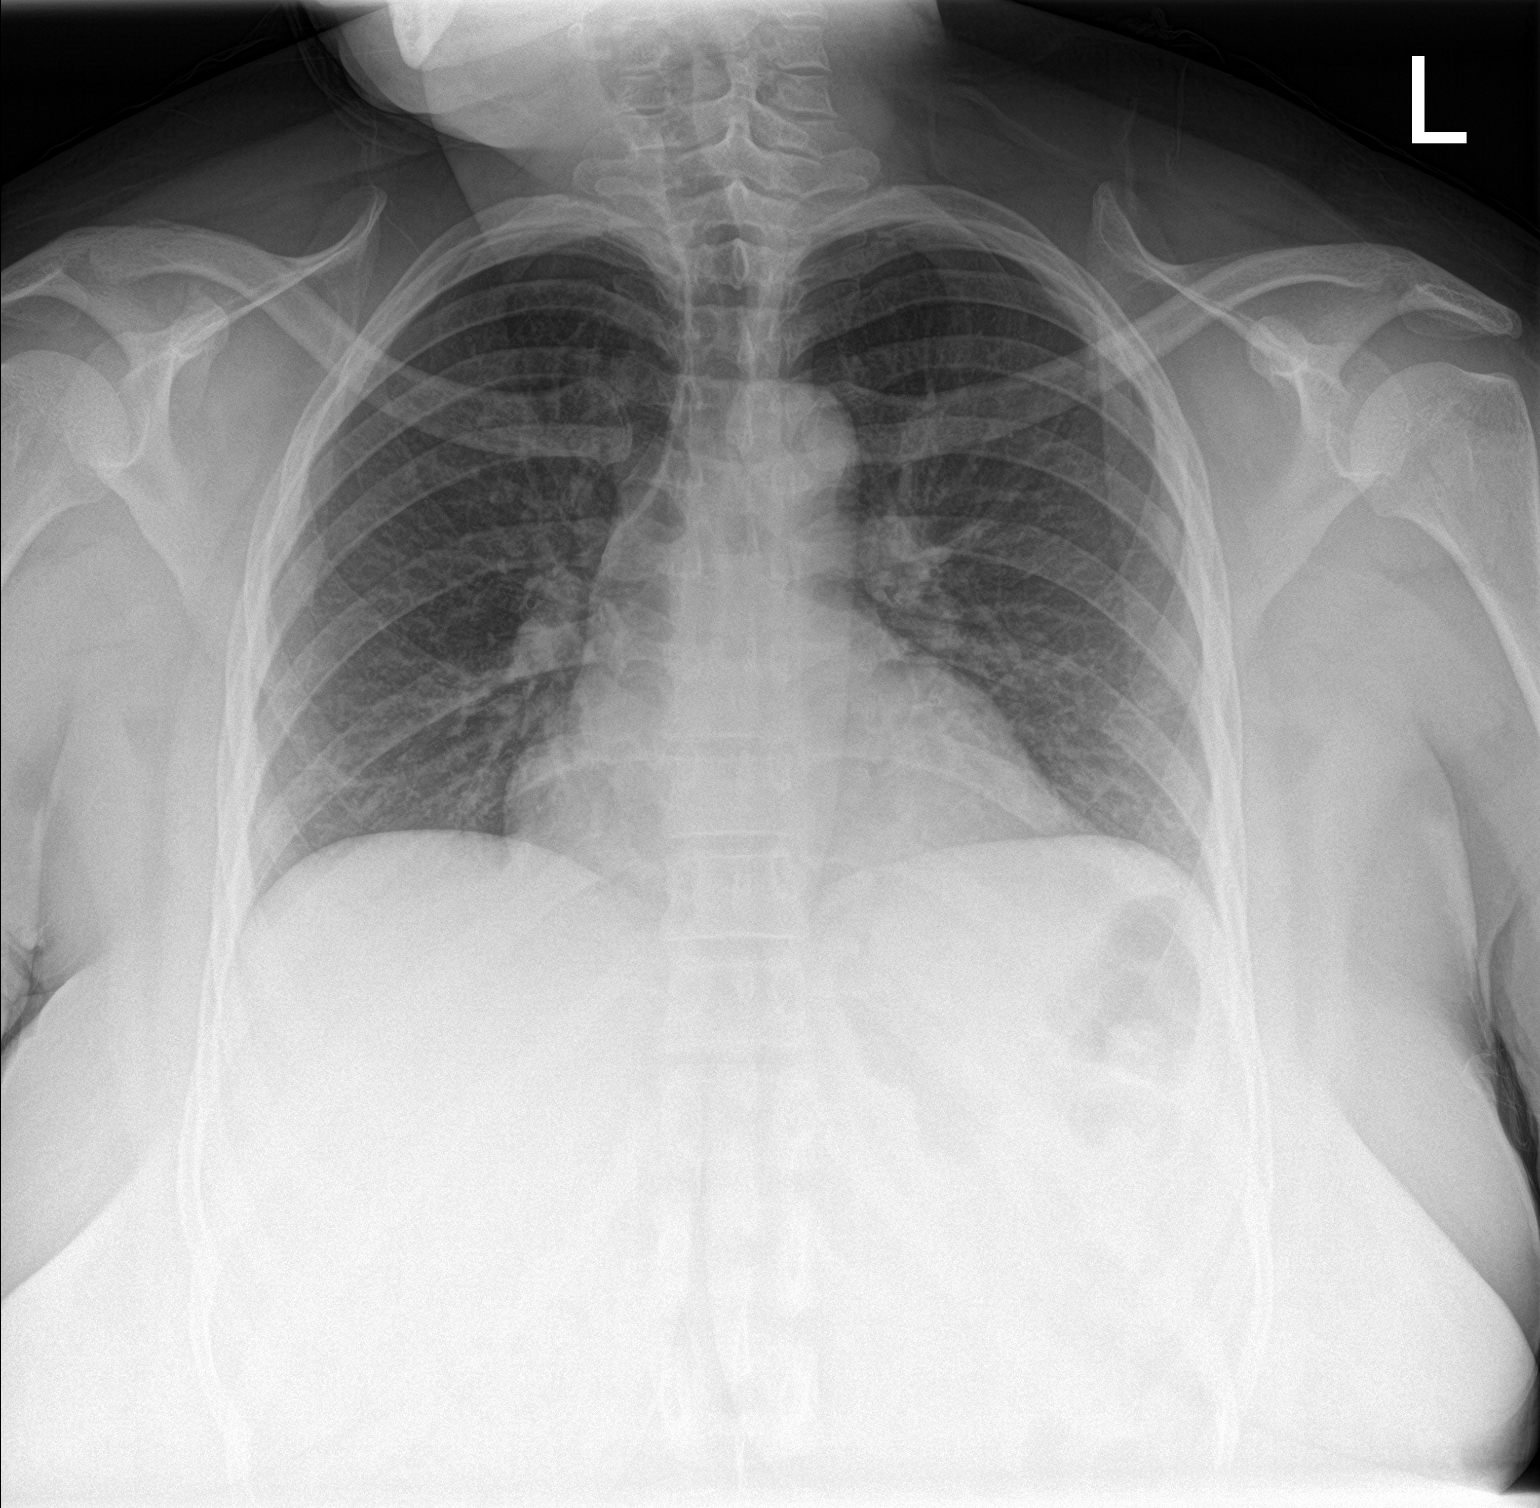

[chest lat]
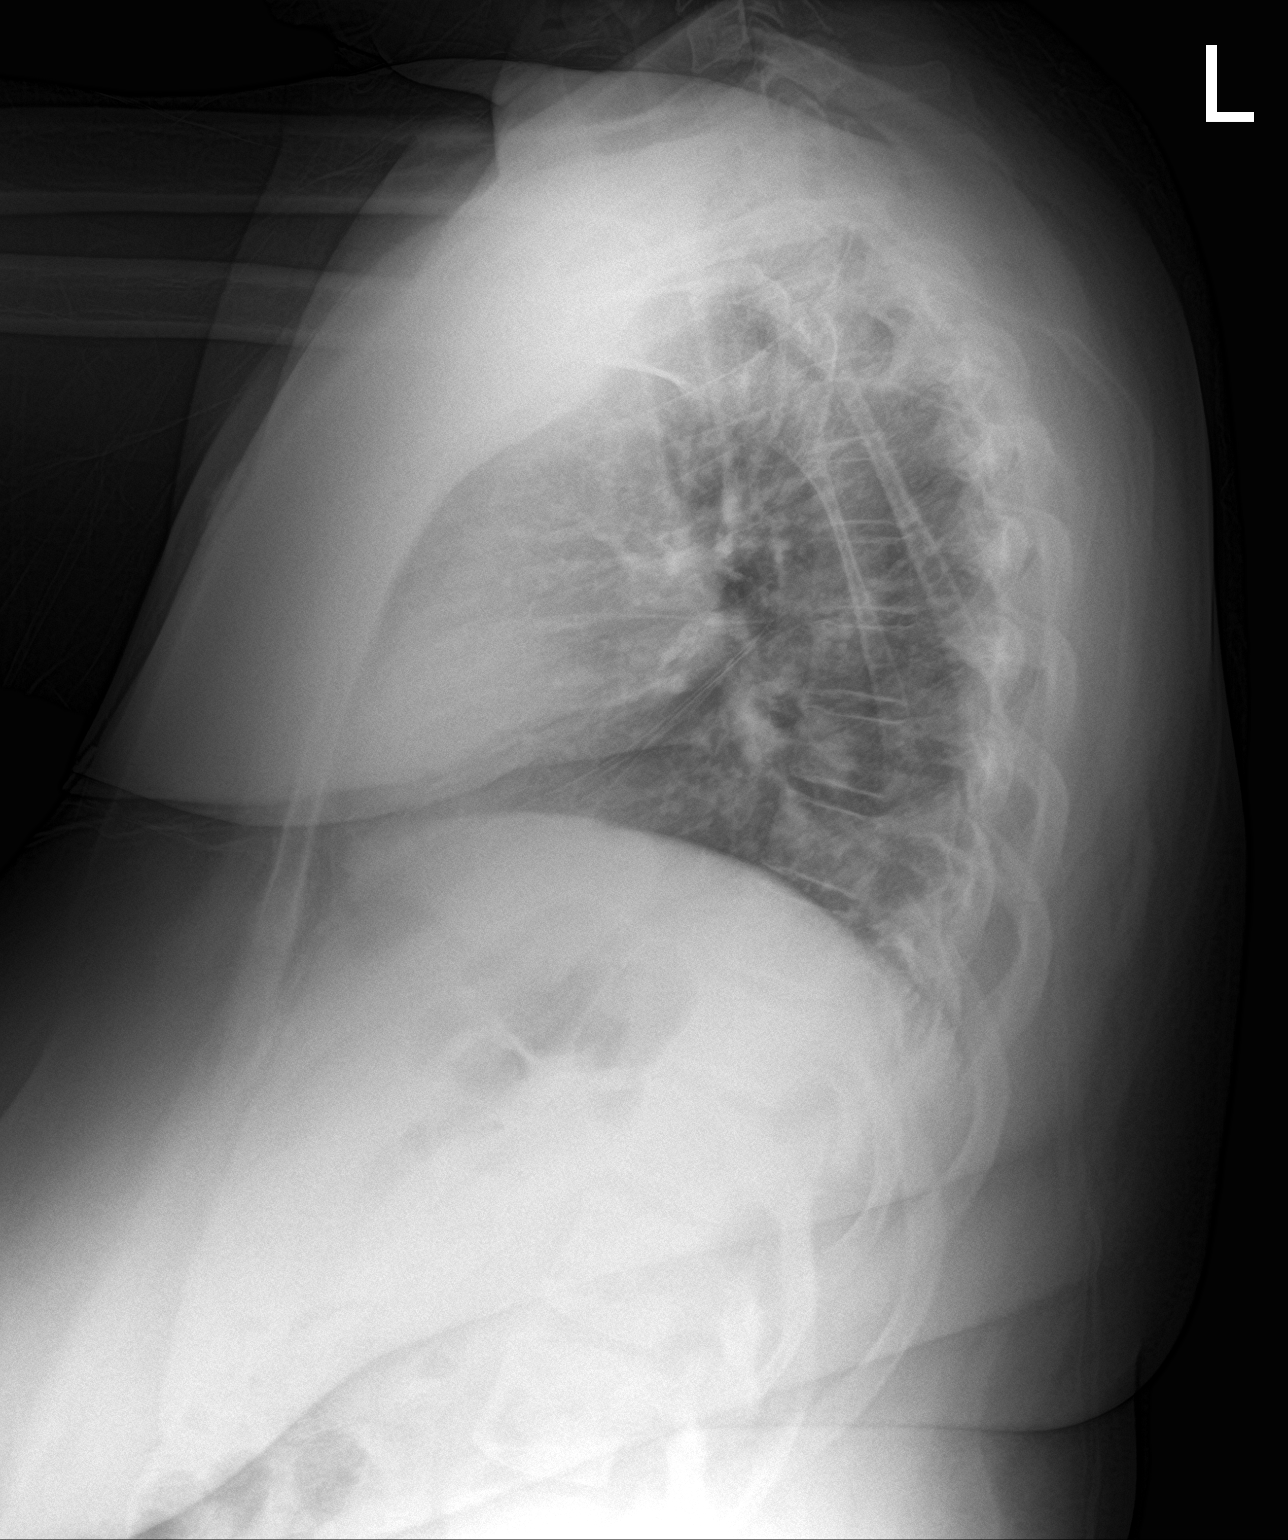

[2 of 2 positions shown; findings below may reference images not displayed]

FINDINGS: Cardiac shadow is at the upper limits of normal in size. The lungs
are clear bilaterally. No focal infiltrate or effusion is noted. No
bony abnormality is seen.
IMPRESSION: No acute abnormality noted.

## 2022-04-21 ENCOUNTER — Other Ambulatory Visit: Payer: Self-pay | Admitting: Internal Medicine

## 2022-04-22 LAB — CBC
HCT: 31.7 % — ABNORMAL LOW (ref 35.0–45.0)
Hemoglobin: 8.8 g/dL — ABNORMAL LOW (ref 11.7–15.5)
MCH: 18.5 pg — ABNORMAL LOW (ref 27.0–33.0)
MCHC: 27.8 g/dL — ABNORMAL LOW (ref 32.0–36.0)
MCV: 66.6 fL — ABNORMAL LOW (ref 80.0–100.0)
MPV: 10.6 fL (ref 7.5–12.5)
Platelets: 515 10*3/uL — ABNORMAL HIGH (ref 140–400)
RBC: 4.76 10*6/uL (ref 3.80–5.10)
RDW: 16.9 % — ABNORMAL HIGH (ref 11.0–15.0)
WBC: 6 10*3/uL (ref 3.8–10.8)

## 2022-10-27 ENCOUNTER — Other Ambulatory Visit: Payer: Self-pay | Admitting: Internal Medicine

## 2022-10-27 DIAGNOSIS — Z0000 Encounter for general adult medical examination without abnormal findings: Secondary | ICD-10-CM | POA: Diagnosis not present

## 2022-10-27 DIAGNOSIS — D649 Anemia, unspecified: Secondary | ICD-10-CM | POA: Diagnosis not present

## 2022-10-27 DIAGNOSIS — Z23 Encounter for immunization: Secondary | ICD-10-CM | POA: Diagnosis not present

## 2022-10-27 DIAGNOSIS — Z13220 Encounter for screening for lipoid disorders: Secondary | ICD-10-CM | POA: Diagnosis not present

## 2022-10-27 DIAGNOSIS — E559 Vitamin D deficiency, unspecified: Secondary | ICD-10-CM | POA: Diagnosis not present

## 2022-10-27 DIAGNOSIS — Z131 Encounter for screening for diabetes mellitus: Secondary | ICD-10-CM | POA: Diagnosis not present

## 2022-10-28 LAB — COMPLETE METABOLIC PANEL WITH GFR
AG Ratio: 1.4 (calc) (ref 1.0–2.5)
ALT: 16 U/L (ref 6–29)
AST: 17 U/L (ref 10–30)
Albumin: 3.8 g/dL (ref 3.6–5.1)
Alkaline phosphatase (APISO): 67 U/L (ref 31–125)
BUN: 10 mg/dL (ref 7–25)
CO2: 23 mmol/L (ref 20–32)
Calcium: 9 mg/dL (ref 8.6–10.2)
Chloride: 106 mmol/L (ref 98–110)
Creat: 0.76 mg/dL (ref 0.50–0.99)
Globulin: 2.8 g/dL (calc) (ref 1.9–3.7)
Glucose, Bld: 113 mg/dL — ABNORMAL HIGH (ref 65–99)
Potassium: 4 mmol/L (ref 3.5–5.3)
Sodium: 138 mmol/L (ref 135–146)
Total Bilirubin: 0.4 mg/dL (ref 0.2–1.2)
Total Protein: 6.6 g/dL (ref 6.1–8.1)
eGFR: 100 mL/min/{1.73_m2} (ref >=60)

## 2022-10-28 LAB — CBC
HCT: 30.2 % — ABNORMAL LOW (ref 35.0–45.0)
Hemoglobin: 8.5 g/dL — ABNORMAL LOW (ref 11.7–15.5)
MCH: 17.9 pg — ABNORMAL LOW (ref 27.0–33.0)
MCHC: 28.1 g/dL — ABNORMAL LOW (ref 32.0–36.0)
MCV: 63.7 fL — ABNORMAL LOW (ref 80.0–100.0)
MPV: 10.9 fL (ref 7.5–12.5)
Platelets: 429 10*3/uL — ABNORMAL HIGH (ref 140–400)
RBC: 4.74 10*6/uL (ref 3.80–5.10)
RDW: 18.4 % — ABNORMAL HIGH (ref 11.0–15.0)
WBC: 5.4 10*3/uL (ref 3.8–10.8)

## 2022-10-28 LAB — LIPID PANEL
Cholesterol: 152 mg/dL (ref <200)
HDL: 45 mg/dL — ABNORMAL LOW (ref >=50)
LDL Cholesterol (Calc): 80 mg/dL (calc)
Non-HDL Cholesterol (Calc): 107 mg/dL (calc) (ref <130)
Total CHOL/HDL Ratio: 3.4 (calc) (ref <5.0)
Triglycerides: 174 mg/dL — ABNORMAL HIGH (ref <150)

## 2022-10-28 LAB — TSH: TSH: 4.69 mIU/L — ABNORMAL HIGH

## 2022-10-28 LAB — VITAMIN D 25 HYDROXY (VIT D DEFICIENCY, FRACTURES): Vit D, 25-Hydroxy: 28 ng/mL — ABNORMAL LOW (ref 30–100)

## 2023-03-23 DIAGNOSIS — M545 Low back pain, unspecified: Secondary | ICD-10-CM | POA: Diagnosis not present

## 2023-03-23 DIAGNOSIS — J302 Other seasonal allergic rhinitis: Secondary | ICD-10-CM | POA: Diagnosis not present

## 2023-03-23 DIAGNOSIS — Z7189 Other specified counseling: Secondary | ICD-10-CM | POA: Diagnosis not present

## 2023-03-23 DIAGNOSIS — Z6841 Body Mass Index (BMI) 40.0 and over, adult: Secondary | ICD-10-CM | POA: Diagnosis not present

## 2023-03-23 DIAGNOSIS — N390 Urinary tract infection, site not specified: Secondary | ICD-10-CM | POA: Diagnosis not present

## 2023-04-27 DIAGNOSIS — M545 Low back pain, unspecified: Secondary | ICD-10-CM | POA: Diagnosis not present

## 2023-04-27 DIAGNOSIS — Z7189 Other specified counseling: Secondary | ICD-10-CM | POA: Diagnosis not present

## 2023-04-27 DIAGNOSIS — D6489 Other specified anemias: Secondary | ICD-10-CM | POA: Diagnosis not present

## 2023-04-27 DIAGNOSIS — E669 Obesity, unspecified: Secondary | ICD-10-CM | POA: Diagnosis not present

## 2023-04-27 DIAGNOSIS — Z6841 Body Mass Index (BMI) 40.0 and over, adult: Secondary | ICD-10-CM | POA: Diagnosis not present

## 2023-04-27 DIAGNOSIS — J302 Other seasonal allergic rhinitis: Secondary | ICD-10-CM | POA: Diagnosis not present

## 2023-05-15 ENCOUNTER — Other Ambulatory Visit: Payer: Self-pay

## 2023-05-15 ENCOUNTER — Emergency Department (HOSPITAL_COMMUNITY)
Admission: EM | Admit: 2023-05-15 | Discharge: 2023-05-16 | Disposition: A | Discharge status: Home / Self Care | Payer: BC Managed Care – PPO | Attending: Emergency Medicine | Admitting: Emergency Medicine

## 2023-05-15 DIAGNOSIS — R509 Fever, unspecified: Secondary | ICD-10-CM | POA: Diagnosis not present

## 2023-05-15 DIAGNOSIS — Z1152 Encounter for screening for COVID-19: Secondary | ICD-10-CM | POA: Diagnosis not present

## 2023-05-15 DIAGNOSIS — J029 Acute pharyngitis, unspecified: Secondary | ICD-10-CM | POA: Diagnosis not present

## 2023-05-15 DIAGNOSIS — R519 Headache, unspecified: Secondary | ICD-10-CM | POA: Diagnosis not present

## 2023-05-15 DIAGNOSIS — B349 Viral infection, unspecified: Secondary | ICD-10-CM | POA: Insufficient documentation

## 2023-05-15 DIAGNOSIS — R079 Chest pain, unspecified: Secondary | ICD-10-CM | POA: Diagnosis not present

## 2023-05-15 LAB — SARS CORONAVIRUS 2 BY RT PCR: SARS Coronavirus 2 by RT PCR: NEGATIVE

## 2023-05-15 MED ORDER — ACETAMINOPHEN 500 MG PO TABS
1000.0000 mg | ORAL_TABLET | Freq: Once | ORAL | Status: AC
  Administered 2023-05-15: 1000 mg via ORAL
  Filled 2023-05-15: qty 2

## 2023-05-15 NOTE — ED Triage Notes (Signed)
Patient reports generalized body aches, sore throat, eye pain, headache since last Friday. Denies sick contacts.

## 2023-05-16 ENCOUNTER — Emergency Department (HOSPITAL_COMMUNITY): Payer: BC Managed Care – PPO

## 2023-05-16 DIAGNOSIS — R519 Headache, unspecified: Secondary | ICD-10-CM | POA: Diagnosis not present

## 2023-05-16 DIAGNOSIS — J029 Acute pharyngitis, unspecified: Secondary | ICD-10-CM | POA: Diagnosis not present

## 2023-05-16 DIAGNOSIS — R079 Chest pain, unspecified: Secondary | ICD-10-CM | POA: Diagnosis not present

## 2023-05-16 LAB — URINALYSIS, ROUTINE W REFLEX MICROSCOPIC
Bilirubin Urine: NEGATIVE
Glucose, UA: NEGATIVE mg/dL
Hgb urine dipstick: NEGATIVE
Ketones, ur: 20 mg/dL — AB
Leukocytes,Ua: NEGATIVE
Nitrite: NEGATIVE
Protein, ur: NEGATIVE mg/dL
Specific Gravity, Urine: 1.021 (ref 1.005–1.030)
pH: 5 (ref 5.0–8.0)

## 2023-05-16 MED ORDER — ACETAMINOPHEN 500 MG PO TABS
1000.0000 mg | ORAL_TABLET | Freq: Once | ORAL | Status: AC
  Administered 2023-05-16: 1000 mg via ORAL
  Filled 2023-05-16: qty 2

## 2023-05-16 MED ORDER — IBUPROFEN 800 MG PO TABS
800.0000 mg | ORAL_TABLET | Freq: Once | ORAL | Status: AC
  Administered 2023-05-16: 800 mg via ORAL
  Filled 2023-05-16: qty 1

## 2023-05-16 NOTE — ED Provider Notes (Signed)
[facility] Emergency Department Provider Note MRN:  [id number]  Arrival date & time: 05/16/23     Chief Complaint   Generalized Body Aches   History of Present Illness   Kathleen Fletcher is a 42 y.o. year-old female presents to the ED with chief complaint of fever and generalized bodyaches x 1 week.  She reports associated cough and dysuria.  She denies sore throat, nausea, vomiting, or diarrhea.  Denies any chest pain, shortness of breath, or abdominal pain.  She denies any successful treatments prior to arrival.  Patient was noted to have mildly elevated temperature in triage and was given Tylenol.  History provided by patient.   Review of Systems  Pertinent positive and negative review of systems noted in HPI.    Physical Exam   Vitals:   05/16/23 0031 05/16/23 0145  BP: 120/61   Pulse: 82   Resp: 16   Temp: (!) 100.6 F (38.1 C) (!) 101.1 F (38.4 C)  SpO2: 100%     CONSTITUTIONAL:  non toxic-appearing, NAD NEURO:  Alert and oriented x 3, CN 3-12 grossly intact EYES:  eyes equal and reactive ENT/NECK:  Supple, no stridor, no oropharyngeal erythema or edema CARDIO:  normal rate, regular rhythm, appears well-perfused  PULM:  No respiratory distress, CTAB GI/GU:  non-distended, no focal tenderness MSK/SPINE:  No gross deformities, no edema, moves all extremities  SKIN:  no rash, atraumatic   *Additional and/or pertinent findings included in MDM below  Diagnostic and Interventional Summary    EKG Interpretation  Date/Time:    Ventricular Rate:    PR Interval:    QRS Duration:   QT Interval:    QTC Calculation:   R Axis:     Text Interpretation:         Labs Reviewed  URINALYSIS, ROUTINE W REFLEX MICROSCOPIC - Abnormal; Notable for the following components:      Result Value   APPearance HAZY (*)    Ketones, ur 20 (*)    All other components within normal limits  SARS CORONAVIRUS 2 BY RT PCR    DG Chest 2 View  Final Result       Medications  acetaminophen (TYLENOL) tablet 1,000 mg (has no administration in time range)  acetaminophen (TYLENOL) tablet 1,000 mg (1,000 mg Oral Given 05/15/23 1828)  ibuprofen (ADVIL) tablet 800 mg (800 mg Oral Given 05/16/23 0145)     Procedures  /  Critical Care Procedures  ED Course and Medical Decision Making  I have reviewed the triage vital signs, the nursing notes, and pertinent available records from the EMR.  Social Determinants Affecting Complexity of Care: Patient has no clinically significant social determinants affecting this chief complaint..   ED Course:    Medical Decision Making Patient here with fevers and generalized bodyaches for the past week.  She does also report cough and dysuria.  Will add chest x-ray and urinalysis to workup ordered in triage.  She does not appear toxic.  Patient would like to go home. UA results are negative for infection.  Uncertain cause of the fever, but patient is non-toxic and appears stable.  I suspect viral syndrome.  Will have her follow-up with PCP or return if symptoms worsen.    Amount and/or Complexity of Data Reviewed Labs: ordered.    Details: COVID test negative. Radiology: ordered and independent interpretation performed.    Details: No opacity  Risk OTC drugs. Prescription drug management.     Consultants: No consultations  were needed in caring for this patient.   Treatment and Plan: Emergency department workup does not suggest an emergent condition requiring admission or immediate intervention beyond  what has been performed at this time. The patient is safe for discharge and has  been instructed to return immediately for worsening symptoms, change in  symptoms or any other concerns    Final Clinical Impressions(s) / ED Diagnoses     ICD-10-CM   1. Viral illness  B34.9       ED Discharge Orders     None         Discharge Instructions Discussed with and Provided to Patient:      Discharge Instructions      Your chest x-ray looked good.  Your COVID test was negative.  Your urine looked good.    Please follow-up with your regular doctor.  Please return to the ER if your symptoms worsen.       Roxy Horseman, PA-C 05/16/23 Silverio Lay, April, MD 05/16/23 1610

## 2023-05-16 NOTE — Discharge Instructions (Addendum)
Your chest x-ray looked good.  Your COVID test was negative.  Your urine looked good.    Please follow-up with your regular doctor.  Please return to the ER if your symptoms worsen.

## 2023-08-03 ENCOUNTER — Other Ambulatory Visit: Payer: Self-pay | Admitting: Internal Medicine

## 2023-08-03 DIAGNOSIS — Z131 Encounter for screening for diabetes mellitus: Secondary | ICD-10-CM | POA: Diagnosis not present

## 2023-08-03 DIAGNOSIS — Z13220 Encounter for screening for lipoid disorders: Secondary | ICD-10-CM | POA: Diagnosis not present

## 2023-08-03 DIAGNOSIS — D649 Anemia, unspecified: Secondary | ICD-10-CM | POA: Diagnosis not present

## 2023-08-03 DIAGNOSIS — Z0000 Encounter for general adult medical examination without abnormal findings: Secondary | ICD-10-CM | POA: Diagnosis not present

## 2023-08-03 DIAGNOSIS — Z6841 Body Mass Index (BMI) 40.0 and over, adult: Secondary | ICD-10-CM | POA: Diagnosis not present

## 2023-08-03 DIAGNOSIS — J302 Other seasonal allergic rhinitis: Secondary | ICD-10-CM | POA: Diagnosis not present

## 2023-08-03 DIAGNOSIS — Z7189 Other specified counseling: Secondary | ICD-10-CM | POA: Diagnosis not present

## 2023-08-03 DIAGNOSIS — E559 Vitamin D deficiency, unspecified: Secondary | ICD-10-CM | POA: Diagnosis not present

## 2023-08-04 LAB — CBC
HCT: 31.8 % — ABNORMAL LOW (ref 35.0–45.0)
Hemoglobin: 8.6 g/dL — ABNORMAL LOW (ref 11.7–15.5)
MCH: 17.9 pg — ABNORMAL LOW (ref 27.0–33.0)
MCHC: 27 g/dL — ABNORMAL LOW (ref 32.0–36.0)
MCV: 66.1 fL — ABNORMAL LOW (ref 80.0–100.0)
MPV: 10.3 fL (ref 7.5–12.5)
Platelets: 523 10*3/uL — ABNORMAL HIGH (ref 140–400)
RBC: 4.81 10*6/uL (ref 3.80–5.10)
RDW: 20.6 % — ABNORMAL HIGH (ref 11.0–15.0)
WBC: 5.7 10*3/uL (ref 3.8–10.8)

## 2023-08-04 LAB — COMPLETE METABOLIC PANEL WITH GFR
AG Ratio: 1.3 (calc) (ref 1.0–2.5)
ALT: 10 U/L (ref 6–29)
AST: 15 U/L (ref 10–30)
Albumin: 3.5 g/dL — ABNORMAL LOW (ref 3.6–5.1)
Alkaline phosphatase (APISO): 72 U/L (ref 31–125)
BUN: 8 mg/dL (ref 7–25)
CO2: 23 mmol/L (ref 20–32)
Calcium: 8.9 mg/dL (ref 8.6–10.2)
Chloride: 106 mmol/L (ref 98–110)
Creat: 0.6 mg/dL (ref 0.50–0.99)
Globulin: 2.7 g/dL (ref 1.9–3.7)
Glucose, Bld: 103 mg/dL — ABNORMAL HIGH (ref 65–99)
Potassium: 4.3 mmol/L (ref 3.5–5.3)
Sodium: 137 mmol/L (ref 135–146)
Total Bilirubin: 0.3 mg/dL (ref 0.2–1.2)
Total Protein: 6.2 g/dL (ref 6.1–8.1)
eGFR: 114 mL/min/{1.73_m2} (ref >=60)

## 2023-08-04 LAB — LIPID PANEL
Cholesterol: 153 mg/dL (ref <200)
HDL: 53 mg/dL (ref >=50)
LDL Cholesterol (Calc): 81 mg/dL
Non-HDL Cholesterol (Calc): 100 mg/dL (calc) (ref <130)
Total CHOL/HDL Ratio: 2.9 (calc) (ref <5.0)
Triglycerides: 94 mg/dL (ref <150)

## 2023-08-04 LAB — TSH: TSH: 3.73 m[IU]/L

## 2023-08-04 LAB — VITAMIN D 25 HYDROXY (VIT D DEFICIENCY, FRACTURES): Vit D, 25-Hydroxy: 33 ng/mL (ref 30–100)

## 2023-10-09 ENCOUNTER — Encounter: Payer: Self-pay | Admitting: Student

## 2023-10-09 ENCOUNTER — Ambulatory Visit: Payer: BC Managed Care – PPO | Admitting: Student

## 2023-10-09 ENCOUNTER — Other Ambulatory Visit (HOSPITAL_COMMUNITY)
Admission: RE | Admit: 2023-10-09 | Discharge: 2023-10-09 | Disposition: A | Discharge status: Home / Self Care | Payer: BC Managed Care – PPO | Source: Ambulatory Visit | Attending: Student | Admitting: Student

## 2023-10-09 VITALS — Ht 68.0 in | Wt 284.6 lb

## 2023-10-09 DIAGNOSIS — Z01419 Encounter for gynecological examination (general) (routine) without abnormal findings: Secondary | ICD-10-CM

## 2023-10-09 NOTE — Progress Notes (Signed)
NGYN presents for AEX. Last PAP unknown  Last mammogram appr. 3 years ago Requesting STD testing, Declines BC

## 2023-10-09 NOTE — Progress Notes (Signed)
   ANNUAL EXAM Patient name: Kathleen Fletcher MRN [id number]  Date of birth: 1981/07/25 Chief Complaint:   No chief complaint on file.  History of Present Illness:   Kathleen Fletcher is a 42 y.o. G61P4000 African-American female being seen today for a routine annual exam.  Current complaints: none  Patient's last menstrual period was 09/18/2023. Regular periods   The pregnancy intention screening data noted above was reviewed. Potential methods of contraception were discussed. The patient elected to proceed with No data recorded.   Last pap unknown. Results were: N/A. H/O abnormal pap: no Last mammogram: 2021. Results were: normal. Family h/o breast cancer: no Last colonoscopy: n/a. Results were: N/A. Family h/o colorectal cancer: no      No data to display               No data to display           Review of Systems:   Pertinent items are noted in HPI Denies any headaches, blurred vision, fatigue, shortness of breath, chest pain, abdominal pain, abnormal vaginal discharge/itching/odor/irritation, problems with periods, bowel movements, urination, or intercourse unless otherwise stated above. Pertinent History Reviewed:  Reviewed past medical,surgical, social and family history.  Reviewed problem list, medications and allergies. Physical Assessment:   Vitals:   10/09/23 1031  Weight: 284 lb 9.6 oz (129.1 kg)  Height: 5\' 8"  (1.727 m)  Body mass index is 43.27 kg/m.        Physical Examination:   General appearance - well appearing, and in no distress  Mental status - alert, oriented to person, place, and time  Psych:  She has a normal mood and affect  Skin - warm and dry, normal color, no suspicious lesions noted  Chest - effort normal, all lung fields clear to auscultation bilaterally  Heart - normal rate and regular rhythm  Neck:  midline trachea, no thyromegaly or nodules  Breasts - breasts appear normal, no suspicious masses, no skin or nipple changes or   axillary nodes  Abdomen - soft, nontender, nondistended, no masses or organomegaly  Pelvic - VULVA: normal appearing vulva with no masses, tenderness or lesions  VAGINA: normal appearing vagina with normal color and discharge, no lesions  CERVIX: normal appearing cervix without discharge or lesions, no CMT  Thin prep pap is done with HR HPV cotesting  Extremities:  No swelling or varicosities noted  Chaperone present for exam  No results found for this or any previous visit (from the past 24 hour(s)).  Assessment & Plan:  1. Women's annual routine gynecological examination - Cytology - PAP( Post) - MM 3D SCREENING MAMMOGRAM BILATERAL BREAST; Future - Cervicovaginal ancillary only - HIV antibody (with reflex) - Hepatitis B Surface AntiGEN - Hepatitis C Antibody - RPR - HgB A1c   Labs/procedures today: see below  Mammogram: schedule screening mammo as soon as possible, or sooner if problems Colonoscopy: @ 43yo, or sooner if problems  Orders Placed This Encounter  Procedures   MM 3D SCREENING MAMMOGRAM BILATERAL BREAST   HIV antibody (with reflex)   Hepatitis B Surface AntiGEN   Hepatitis C Antibody   RPR   HgB A1c    Meds: No orders of the defined types were placed in this encounter.   Follow-up: Return in about 1 year (around 10/08/2024), or if symptoms worsen or fail to improve, for ANN.  Corlis Hove, NP 10/09/2023 11:01 AM

## 2023-10-10 LAB — RPR: RPR Ser Ql: NONREACTIVE

## 2023-10-10 LAB — HIV ANTIBODY (ROUTINE TESTING W REFLEX): HIV Screen 4th Generation wRfx: NONREACTIVE

## 2023-10-10 LAB — HEPATITIS B SURFACE ANTIGEN: Hepatitis B Surface Ag: NEGATIVE

## 2023-10-10 LAB — HEMOGLOBIN A1C
Est. average glucose Bld gHb Est-mCnc: 103 mg/dL
Hgb A1c MFr Bld: 5.2 % (ref 4.8–5.6)

## 2023-10-10 LAB — HEPATITIS C ANTIBODY: Hep C Virus Ab: REACTIVE — AB

## 2023-10-12 LAB — CERVICOVAGINAL ANCILLARY ONLY
Chlamydia: NEGATIVE
Comment: NEGATIVE
Comment: NEGATIVE
Comment: NORMAL
Neisseria Gonorrhea: NEGATIVE
Trichomonas: NEGATIVE

## 2023-10-13 ENCOUNTER — Other Ambulatory Visit: Payer: Self-pay | Admitting: Student

## 2023-10-13 DIAGNOSIS — R768 Other specified abnormal immunological findings in serum: Secondary | ICD-10-CM | POA: Insufficient documentation

## 2023-10-14 LAB — CYTOLOGY - PAP
Adequacy: ABSENT
Comment: NEGATIVE
Diagnosis: NEGATIVE
High risk HPV: NEGATIVE

## 2023-10-14 NOTE — Progress Notes (Signed)
TC to pt at [phone]. Call went to VM. HIPAA compliant message left requesting pt to call the office to further discuss recent lab results. Call back number included in message.

## 2023-11-02 DIAGNOSIS — Z7189 Other specified counseling: Secondary | ICD-10-CM | POA: Diagnosis not present

## 2023-11-02 DIAGNOSIS — J302 Other seasonal allergic rhinitis: Secondary | ICD-10-CM | POA: Diagnosis not present

## 2023-11-02 DIAGNOSIS — Z6841 Body Mass Index (BMI) 40.0 and over, adult: Secondary | ICD-10-CM | POA: Diagnosis not present

## 2023-11-02 DIAGNOSIS — D649 Anemia, unspecified: Secondary | ICD-10-CM | POA: Diagnosis not present

## 2023-11-09 ENCOUNTER — Encounter: Payer: Self-pay | Admitting: *Deleted

## 2023-11-23 ENCOUNTER — Ambulatory Visit: Payer: BC Managed Care – PPO

## 2023-12-14 DIAGNOSIS — D649 Anemia, unspecified: Secondary | ICD-10-CM | POA: Diagnosis not present

## 2023-12-14 DIAGNOSIS — Z6841 Body Mass Index (BMI) 40.0 and over, adult: Secondary | ICD-10-CM | POA: Diagnosis not present

## 2023-12-14 DIAGNOSIS — Z7189 Other specified counseling: Secondary | ICD-10-CM | POA: Diagnosis not present

## 2023-12-14 DIAGNOSIS — M5430 Sciatica, unspecified side: Secondary | ICD-10-CM | POA: Diagnosis not present

## 2023-12-14 DIAGNOSIS — J302 Other seasonal allergic rhinitis: Secondary | ICD-10-CM | POA: Diagnosis not present

## 2024-02-08 DIAGNOSIS — M545 Low back pain, unspecified: Secondary | ICD-10-CM | POA: Diagnosis not present

## 2024-02-08 DIAGNOSIS — M25511 Pain in right shoulder: Secondary | ICD-10-CM | POA: Diagnosis not present

## 2024-02-19 DIAGNOSIS — M25511 Pain in right shoulder: Secondary | ICD-10-CM | POA: Diagnosis not present

## 2024-08-13 ENCOUNTER — Encounter (HOSPITAL_COMMUNITY): Payer: Self-pay | Admitting: *Deleted

## 2024-08-13 ENCOUNTER — Emergency Department (HOSPITAL_COMMUNITY)
Admission: EM | Admit: 2024-08-13 | Discharge: 2024-08-13 | Disposition: A | Discharge status: Home / Self Care | Attending: Emergency Medicine | Admitting: Emergency Medicine

## 2024-08-13 ENCOUNTER — Emergency Department (HOSPITAL_COMMUNITY)

## 2024-08-13 ENCOUNTER — Other Ambulatory Visit: Payer: Self-pay

## 2024-08-13 DIAGNOSIS — M79605 Pain in left leg: Secondary | ICD-10-CM | POA: Diagnosis not present

## 2024-08-13 DIAGNOSIS — L97929 Non-pressure chronic ulcer of unspecified part of left lower leg with unspecified severity: Secondary | ICD-10-CM | POA: Diagnosis not present

## 2024-08-13 DIAGNOSIS — L03116 Cellulitis of left lower limb: Secondary | ICD-10-CM | POA: Insufficient documentation

## 2024-08-13 LAB — CBC WITH DIFFERENTIAL/PLATELET
Abs Immature Granulocytes: 0 K/uL (ref 0.00–0.07)
Basophils Absolute: 0 K/uL (ref 0.0–0.1)
Basophils Relative: 1 %
Eosinophils Absolute: 0.1 K/uL (ref 0.0–0.5)
Eosinophils Relative: 1 %
HCT: 38.1 % (ref 36.0–46.0)
Hemoglobin: 11.4 g/dL — ABNORMAL LOW (ref 12.0–15.0)
Immature Granulocytes: 0 %
Lymphocytes Relative: 29 %
Lymphs Abs: 1.5 K/uL (ref 0.7–4.0)
MCH: 21.2 pg — ABNORMAL LOW (ref 26.0–34.0)
MCHC: 29.9 g/dL — ABNORMAL LOW (ref 30.0–36.0)
MCV: 70.8 fL — ABNORMAL LOW (ref 80.0–100.0)
Monocytes Absolute: 0.3 K/uL (ref 0.1–1.0)
Monocytes Relative: 6 %
Neutro Abs: 3.2 K/uL (ref 1.7–7.7)
Neutrophils Relative %: 63 %
Platelets: 406 K/uL — ABNORMAL HIGH (ref 150–400)
RBC: 5.38 MIL/uL — ABNORMAL HIGH (ref 3.87–5.11)
RDW: 16.9 % — ABNORMAL HIGH (ref 11.5–15.5)
WBC: 5 K/uL (ref 4.0–10.5)
nRBC: 0 % (ref 0.0–0.2)

## 2024-08-13 LAB — I-STAT CHEM 8, ED
BUN: 8 mg/dL (ref 6–20)
Calcium, Ion: 1.22 mmol/L (ref 1.15–1.40)
Chloride: 103 mmol/L (ref 98–111)
Creatinine, Ser: 0.8 mg/dL (ref 0.44–1.00)
Glucose, Bld: 94 mg/dL (ref 70–99)
HCT: 40 % (ref 36.0–46.0)
Hemoglobin: 13.6 g/dL (ref 12.0–15.0)
Potassium: 3.9 mmol/L (ref 3.5–5.1)
Sodium: 141 mmol/L (ref 135–145)
TCO2: 26 mmol/L (ref 22–32)

## 2024-08-13 LAB — COMPREHENSIVE METABOLIC PANEL WITH GFR
ALT: 17 U/L (ref 0–44)
AST: 22 U/L (ref 15–41)
Albumin: 3.5 g/dL (ref 3.5–5.0)
Alkaline Phosphatase: 70 U/L (ref 38–126)
Anion gap: 10 (ref 5–15)
BUN: 8 mg/dL (ref 6–20)
CO2: 24 mmol/L (ref 22–32)
Calcium: 9.3 mg/dL (ref 8.9–10.3)
Chloride: 104 mmol/L (ref 98–111)
Creatinine, Ser: 0.73 mg/dL (ref 0.44–1.00)
GFR, Estimated: >60 mL/min (ref >60)
Glucose, Bld: 96 mg/dL (ref 70–99)
Potassium: 3.7 mmol/L (ref 3.5–5.1)
Sodium: 138 mmol/L (ref 135–145)
Total Bilirubin: 0.8 mg/dL (ref 0.0–1.2)
Total Protein: 7.3 g/dL (ref 6.5–8.1)

## 2024-08-13 LAB — SEDIMENTATION RATE: Sed Rate: 14 mm/h (ref 0–22)

## 2024-08-13 LAB — HCG, SERUM, QUALITATIVE: Preg, Serum: NEGATIVE

## 2024-08-13 LAB — C-REACTIVE PROTEIN: CRP: 0.6 mg/dL (ref <1.0)

## 2024-08-13 MED ORDER — DOXYCYCLINE HYCLATE 100 MG PO CAPS
100.0000 mg | ORAL_CAPSULE | Freq: Two times a day (BID) | ORAL | 0 refills | Status: AC
Start: 2024-08-13 — End: 2024-08-27

## 2024-08-13 MED ORDER — DOXYCYCLINE HYCLATE 100 MG IV SOLR
100.0000 mg | Freq: Once | INTRAVENOUS | Status: AC
  Administered 2024-08-13: 100 mg via INTRAVENOUS
  Filled 2024-08-13: qty 100

## 2024-08-13 MED ORDER — IOHEXOL 350 MG/ML SOLN
75.0000 mL | Freq: Once | INTRAVENOUS | Status: AC | PRN
  Administered 2024-08-13: 75 mL via INTRAVENOUS

## 2024-08-13 NOTE — ED Provider Notes (Signed)
 [facility] EMERGENCY DEPARTMENT AT [facility] Provider Note   CSN: [id number] Arrival date & time: 08/13/24  9253     Patient presents with: Wound Check   Kathleen Fletcher is a 43 y.o. female who presents to the emergency department with a chief complaint of left leg pain.  Patient states that approximately 1 month ago she cut her leg on a grill.  She states that the area started out as a little cut but has since progressed.  She states that this is her first time being seen for this wound, she attempted to make an appointment with primary care but they did not have anything available still 09/01/2024.  Patient denies fever, chills.  She states that she does have left leg pain when walking but is ambulatory without assistance.  She states she has been putting Neosporin ointment on her left leg wound.  She denies significant past medical history and takes no prescription medications at home.  Patient has not been on any antibiotics for this injury.  Denies known history of diabetes.    Wound Check       Prior to Admission medications   Medication Sig Start Date End Date Taking? Authorizing Provider  doxycycline  (VIBRAMYCIN ) 100 MG capsule Take 1 capsule (100 mg total) by mouth 2 (two) times daily for 14 days. 08/13/24 08/27/24 Yes Milany Geck F, PA-C  cholecalciferol (VITAMIN D3) 25 MCG (1000 UNIT) tablet Take 1,000 Units by mouth daily.    [provider]  diphenhydrAMINE  (BENADRYL ) 25 MG tablet Take 1 tablet (25 mg total) by mouth every 12 (twelve) hours for 3 days. 06/13/19 06/16/19  Layden, Lindsey A, PA-C  famotidine  (PEPCID ) 20 MG tablet Take 1 tablet (20 mg total) by mouth 2 (two) times daily for 3 days. 06/13/19 06/16/19  Layden, Lindsey A, PA-C  methocarbamol  (ROBAXIN ) 500 MG tablet Take 1 tablet (500 mg total) by mouth every 8 (eight) hours as needed for muscle spasms. Patient not taking: Reported on 10/09/2023 10/15/21   Petrucelli, Samantha R, PA-C  naproxen  (NAPROSYN )  500 MG tablet Take 1 tablet (500 mg total) by mouth 2 (two) times daily as needed for moderate pain. Patient not taking: Reported on 10/09/2023 10/15/21   Petrucelli, Samantha R, PA-C  pantoprazole  (PROTONIX ) 20 MG tablet Take 1 tablet (20 mg total) by mouth daily for 14 days. 03/05/21 03/19/21  McDonald, Mia A, PA-C    Allergies: Patient has no known allergies.    Review of Systems  Skin:  Positive for wound (left leg wound).    Updated Vital Signs BP (!) 151/99   Pulse 75   Temp 97.8 F (36.6 C) (Oral)   Resp (!) 22   Ht 5' 8 (1.727 m)   Wt 131.1 kg   SpO2 100%   BMI 43.94 kg/m   Physical Exam Vitals and nursing note reviewed.  Constitutional:      General: She is awake. She is not in acute distress.    Appearance: Normal appearance. She is obese. She is not ill-appearing, toxic-appearing or diaphoretic.  HENT:     Head: Normocephalic and atraumatic.  Eyes:     General: No scleral icterus. Pulmonary:     Effort: Pulmonary effort is normal. No respiratory distress.  Musculoskeletal:        General: Normal range of motion.     Right lower leg: No edema.     Left lower leg: No edema.     Comments: Mild tenderness with palpation of  left leg surrounding wound  LLE neurovascularly intact, DP pulse 2+  Skin:    General: Skin is warm.     Capillary Refill: Capillary refill takes less than 2 seconds.     Comments: Foul smelling wound present to lateral left leg, active drainage present  Neurological:     General: No focal deficit present.     Mental Status: She is alert and oriented to person, place, and time.  Psychiatric:        Mood and Affect: Mood normal.        Behavior: Behavior normal. Behavior is cooperative.      (all labs ordered are listed, but only abnormal results are displayed) Labs Reviewed  CBC WITH DIFFERENTIAL/PLATELET - Abnormal; Notable for the following components:      Result Value   RBC 5.38 (*)    Hemoglobin 11.4 (*)    MCV 70.8 (*)    MCH  21.2 (*)    MCHC 29.9 (*)    RDW 16.9 (*)    Platelets 406 (*)    All other components within normal limits  AEROBIC/ANAEROBIC CULTURE W GRAM STAIN (SURGICAL/DEEP WOUND)  COMPREHENSIVE METABOLIC PANEL WITH GFR  HCG, SERUM, QUALITATIVE  SEDIMENTATION RATE  C-REACTIVE PROTEIN  I-STAT CHEM 8, ED    EKG: None  Radiology: CT TIBIA FIBULA LEFT W CONTRAST Result Date: 08/13/2024 CLINICAL DATA:  left leg infection/ulcer Lower leg injury last month with worsening symptoms today. EXAM: CT OF THE LOWER LEFT EXTREMITY WITH CONTRAST TECHNIQUE: Multidetector CT imaging of the left lower leg was performed according to the standard protocol following intravenous contrast administration. RADIATION DOSE REDUCTION: This exam was performed according to the departmental dose-optimization program which includes automated exposure control, adjustment of the mA and/or kV according to patient size and/or use of iterative reconstruction technique. CONTRAST:  75mL OMNIPAQUE  IOHEXOL  350 MG/ML SOLN COMPARISON:  Same day radiographs. FINDINGS: Bones/Joint/Cartilage No evidence of acute fracture, dislocation or osteomyelitis. The joint spaces are preserved at the left knee and ankle, and there are no significant joint effusions. Ligaments Suboptimally assessed by CT. Muscles and Tendons The visualized left lower leg muscles and tendons appear unremarkable. Soft tissues As seen radiographically, there is a soft tissue defect laterally in the distal left lower leg. Associated surrounding dermal thickening and soft tissue stranding in the subcutaneous fat. No focal fluid collection, foreign body or soft tissue emphysema. No significant vascular findings are identified. There are scattered subcutaneous calcifications. IMPRESSION: 1. Soft tissue defect laterally in the distal left lower leg with surrounding dermal thickening and soft tissue stranding in the subcutaneous fat, consistent with cellulitis. No focal fluid collection,  foreign body or soft tissue emphysema. 2. No evidence of deep soft tissue infection. 3. No evidence of osteomyelitis or septic arthritis. Electronically Signed   By: Elsie Perone M.D.   On: 08/13/2024 12:19   DG Tibia/Fibula Left Result Date: 08/13/2024 CLINICAL DATA:  Left lower leg wound EXAM: LEFT TIBIA AND FIBULA - 2 VIEW COMPARISON:  None Available. FINDINGS: There is no evidence of fracture or other focal bone lesions. Diffuse subcutaneous soft tissue reticulations of the lower leg, most notably involving the lateral aspect. Soft tissue defect and ulceration along the distal lateral lower leg, corresponding to reported wound. Well corticated subcutaneous soft tissue calcifications, likely phleboliths. IMPRESSION: 1. Soft tissue defect and ulceration along the distal lateral lower leg, corresponding to reported wound. No radiographic finding of osteomyelitis. 2. Diffuse subcutaneous soft tissue reticulations of the lower leg,  most notably involving the lateral aspect, which may reflect cellulitis. Electronically Signed   By: Limin  Xu M.D.   On: 08/13/2024 09:41     Procedures   Medications Ordered in the ED  iohexol  (OMNIPAQUE ) 350 MG/ML injection 75 mL (75 mLs Intravenous Contrast Given 08/13/24 1029)  doxycycline  (VIBRAMYCIN ) 100 mg in sodium chloride  0.9 % 250 mL IVPB (0 mg Intravenous Stopped 08/13/24 1252)    Clinical Course as of 08/13/24 1720  Sat Aug 13, 2024  1002 Wound culture, abx, home with wound care and outpatient abx, CT ordered  [CH]  1229 CT negative for evidence of deep infection [CH]    Clinical Course User Index [CH] Jaydrian Corpening, Terrall FALCON, PA-C                                 Medical Decision Making Amount and/or Complexity of Data Reviewed Labs: ordered. Radiology: ordered.  Risk Prescription drug management.   Patient presents to the ED for concern of left lower extremity pain, this involves an extensive number of treatment options, and is a complaint that  carries with it a high risk of complications and morbidity.  The differential diagnosis includes laceration, foreign body, foot infection, sepsis, deep abscess, osteomyelitis, cellulitis, etc.   Co morbidities that complicate the patient evaluation  None  Lab Tests:  I Ordered, and personally interpreted labs.  The pertinent results include: CBC and CMP reassuring, pregnancy negative, ESR and CRP not elevated, aerobic and anaerobic culture of wound pending   Imaging Studies ordered:  I ordered imaging studies including x-ray of left hip/fib, CT of the left hip/fib I independently visualized and interpreted imaging which showed cellulitis, no evidence of soft tissue infection I agree with the radiologist interpretation   Medicines ordered and prescription drug management:  I ordered medication including doxycycline  for leg infection Reevaluation of the patient after these medicines showed that the patient stayed the same I have reviewed the patients home medicines and have made adjustments as needed   Test Considered:  None   Critical Interventions:  None   Problem List / ED Course:  43 year old female, vital signs stable, presenting with chief complaint of left leg pain and left leg wound, first time being seen for this, no significant past medical history, denies fever/chills or other systemic symptoms On physical examination impressive golf ball size wound to lateral left leg, foul-smelling, some active drainage present, patient states she has been putting Neosporin on the wound Plan to obtain general infectious lab work as well as inflammatory markers, x-ray imaging of left hip/fib, will reassess pending this work-up, will also obtain culture of wound Lab work reassuring no elevated white blood cell count, patient remained afebrile during emergency department visit, no elevated ESR or CRP X-ray and CT reassuring, no evidence of osteomyelitis or deep abscess, wound culture  performed Due to overall reassuring labs and imaging will discharge patient with outpatient course of antibiotics with wound care follow-up, as well as PCP follow-up  Patient stable at this time, given one dose of doxycyline here in the ED Return precautions given Patient discharged Most likely diagnosis at this time cellulitis from previous laceration left leg no sign of systemic infection as patient is afebrile, vital signs are stable, no elevated white blood cell count or inflammatory markers, imaging not consistent with deep soft tissue infection or osteomyelitis, at this time I feel patient is stable for discharge home with  outpatient antibiotics and wound care follow-up and close follow-up with primary care, patient states she has a primary care appointment coming up on 09/01/2024, I told her to make her primary care provider aware of all of today's workup and findings   Reevaluation:  After the interventions noted above, I reevaluated the patient and found that they have :stayed the same   Social Determinants of Health:  None   Dispostion:  After consideration of the diagnostic results and the patients response to treatment, I feel that the patent would benefit from discharge and outpatient therapy as described.  Close follow-up with primary care provider and contacting wound for an available appointment.     Final diagnoses:  Left leg pain  Cellulitis of left leg    ED Discharge Orders          Ordered    doxycycline  (VIBRAMYCIN ) 100 MG capsule  2 times daily        08/13/24 1343               Janathan Bribiesca F, PA-C 08/13/24 1720    Elnor Bernarda SQUIBB, DO 08/29/24 [phone]

## 2024-08-13 NOTE — ED Triage Notes (Signed)
 C/o wound to her left lower leg , states she was cooking on a grill and scratched her leg onset last month , states it is working worse today

## 2024-08-13 NOTE — ED Notes (Signed)
 Patient transported to CT

## 2024-08-13 NOTE — Discharge Instructions (Addendum)
 It was a pleasure taking care of you today, based on your history, physical exam, labs, and imaging I feel you are safe for discharge. You have been prescribed an antibiotic, please take it as instructed and complete the entire course. Please follow-up with your primary care provider as soon as possible and make an appointment with wound care as soon as possible as well. Please make your primary care provider aware of your work-up today and all findings.  If you experience any of the following symptoms including but not limited to fever, chills, swelling of your left leg, worsening pain of your left leg, spreading redness, worsening drainage, severe pain, or other concerning symptom please return to the emergency department.  Please do not hesitate if the symptoms arise as this is a very serious infection.  Follow-up and completion of antibiotics is very important.  Please limit your time in the sun while on the antibiotic as this antibiotic can make you more prone to sunburns. If symptoms persist or worsen recommend reevaluation in 48 hours by primary care or the emergency department.

## 2024-08-18 ENCOUNTER — Encounter (HOSPITAL_BASED_OUTPATIENT_CLINIC_OR_DEPARTMENT_OTHER): Payer: PRIVATE HEALTH INSURANCE | Attending: Internal Medicine | Admitting: Internal Medicine

## 2024-08-18 ENCOUNTER — Other Ambulatory Visit: Payer: Self-pay

## 2024-08-18 DIAGNOSIS — I87312 Chronic venous hypertension (idiopathic) with ulcer of left lower extremity: Secondary | ICD-10-CM | POA: Insufficient documentation

## 2024-08-18 DIAGNOSIS — L97828 Non-pressure chronic ulcer of other part of left lower leg with other specified severity: Secondary | ICD-10-CM | POA: Insufficient documentation

## 2024-08-18 DIAGNOSIS — T799XXA Unspecified early complication of trauma, initial encounter: Secondary | ICD-10-CM | POA: Diagnosis not present

## 2024-08-18 DIAGNOSIS — T798XXA Other early complications of trauma, initial encounter: Secondary | ICD-10-CM

## 2024-08-18 DIAGNOSIS — D2272 Melanocytic nevi of left lower limb, including hip: Secondary | ICD-10-CM | POA: Diagnosis not present

## 2024-08-18 LAB — AEROBIC/ANAEROBIC CULTURE W GRAM STAIN (SURGICAL/DEEP WOUND)

## 2024-08-19 ENCOUNTER — Telehealth (HOSPITAL_BASED_OUTPATIENT_CLINIC_OR_DEPARTMENT_OTHER): Payer: Self-pay

## 2024-08-19 DIAGNOSIS — L97828 Non-pressure chronic ulcer of other part of left lower leg with other specified severity: Secondary | ICD-10-CM | POA: Diagnosis not present

## 2024-08-19 LAB — DERMATOLOGY PATHOLOGY

## 2024-08-19 NOTE — Telephone Encounter (Signed)
 Post ED Visit - Positive Culture Follow-up: Successful Patient Follow-Up  Culture assessed and recommendations reviewed by:  [x]  Aileen Jemenez, Pharm.D. []  Venetia Gully, Pharm.D., BCPS AQ-ID []  Garrel Crews, Pharm.D., BCPS []  Almarie Lunger, Pharm.D., BCPS []  [place], [place].D., BCPS, AAHIVP []  Rosaline Bihari, Pharm.D., BCPS, AAHIVP []  Vernell Meier, PharmD, BCPS []  Latanya Hint, PharmD, BCPS []  Donald Medley, PharmD, BCPS []  Rocky Bold, PharmD  Positive wound culture  []  Patient discharged without antimicrobial prescription and treatment is now indicated [x]  Organism is resistant to prescribed ED discharge antimicrobial []  Patient with positive blood cultures  Changes discussed with ED provider: Elsie Body, MD New antibiotic prescription Ciprofloxacin 500 mg po BID x 7 days. And Augmentin 875/125 mg po BID x 7 days.  Called to [facility] at [facility] patient, date 08/19/2024 , time 12:05 pm   Kathleen Fletcher 08/19/2024, 12:06 PM

## 2024-08-19 NOTE — Progress Notes (Signed)
 ED Antimicrobial Stewardship Positive Culture Follow Up   Kathleen Fletcher is an 43 y.o. female who presented to [facility] on 08/13/2024 with a chief complaint of L lower leg pain. She has a wound on her leg with active drainage. Culture shows polymicrobial infection.  Chief Complaint  Patient presents with   Wound Check    Recent Results (from the past 720 hours)  Aerobic/Anaerobic Culture w Gram Stain (surgical/deep wound)     Status: None   Collection Time: 08/13/24 10:32 AM   Specimen: Wound  Result Value Ref Range Status   Specimen Description WOUND  Final   Special Requests LEFT LEG  Final   Gram Stain   Final    ABUNDANT WBC PRESENT, PREDOMINANTLY PMN ABUNDANT GRAM POSITIVE COCCI FEW GRAM NEGATIVE RODS    Culture   Final    RARE PSEUDOMONAS AERUGINOSA FEW ESCHERICHIA COLI FEW STREPTOCOCCUS ANGINOSIS FEW PHOCAEICOLA VULGATUS BETA LACTAMASE POSITIVE Performed at [facility], [address], [place], [place] [address]    Report Status 08/18/2024 FINAL  Final   Organism ID, Bacteria PSEUDOMONAS AERUGINOSA  Final   Organism ID, Bacteria ESCHERICHIA COLI  Final   Organism ID, Bacteria STREPTOCOCCUS ANGINOSIS  Final      Susceptibility   Escherichia coli - MIC*    AMPICILLIN <=2 SENSITIVE Sensitive     CEFAZOLIN (NON-URINE) <=1 SENSITIVE Sensitive     CEFEPIME <=0.12 SENSITIVE Sensitive     ERTAPENEM <=0.12 SENSITIVE Sensitive     CEFTRIAXONE <=0.25 SENSITIVE Sensitive     CIPROFLOXACIN >=4 RESISTANT Resistant     GENTAMICIN <=1 SENSITIVE Sensitive     MEROPENEM <=0.25 SENSITIVE Sensitive     TRIMETH/SULFA >=320 RESISTANT Resistant     AMPICILLIN/SULBACTAM <=2 SENSITIVE Sensitive     PIP/TAZO Value in next row Sensitive ug/mL     <=4 SENSITIVEThis is a modified FDA-approved test that has been validated and its performance characteristics determined by the reporting laboratory.  This laboratory is certified under the Clinical Laboratory Improvement Amendments CLIA as  qualified to perform high complexity clinical laboratory testing.    * FEW ESCHERICHIA COLI   Pseudomonas aeruginosa - MIC*    MEROPENEM Value in next row Sensitive      <=4 SENSITIVEThis is a modified FDA-approved test that has been validated and its performance characteristics determined by the reporting laboratory.  This laboratory is certified under the Clinical Laboratory Improvement Amendments CLIA as qualified to perform high complexity clinical laboratory testing.    CIPROFLOXACIN Value in next row Sensitive      <=4 SENSITIVEThis is a modified FDA-approved test that has been validated and its performance characteristics determined by the reporting laboratory.  This laboratory is certified under the Clinical Laboratory Improvement Amendments CLIA as qualified to perform high complexity clinical laboratory testing.    IMIPENEM Value in next row Sensitive      <=4 SENSITIVEThis is a modified FDA-approved test that has been validated and its performance characteristics determined by the reporting laboratory.  This laboratory is certified under the Clinical Laboratory Improvement Amendments CLIA as qualified to perform high complexity clinical laboratory testing.    PIP/TAZO Value in next row Sensitive ug/mL     8 SENSITIVEThis is a modified FDA-approved test that has been validated and its performance characteristics determined by the reporting laboratory.  This laboratory is certified under the Clinical Laboratory Improvement Amendments CLIA as qualified to perform high complexity clinical laboratory testing.    CEFEPIME Value in next row  Sensitive      8 SENSITIVEThis is a modified FDA-approved test that has been validated and its performance characteristics determined by the reporting laboratory.  This laboratory is certified under the Clinical Laboratory Improvement Amendments CLIA as qualified to perform high complexity clinical laboratory testing.    CEFTAZIDIME/AVIBACTAM Value in next row  Sensitive ug/mL     8 SENSITIVEThis is a modified FDA-approved test that has been validated and its performance characteristics determined by the reporting laboratory.  This laboratory is certified under the Clinical Laboratory Improvement Amendments CLIA as qualified to perform high complexity clinical laboratory testing.    CEFTOLOZANE/TAZOBACTAM Value in next row Sensitive ug/mL     8 SENSITIVEThis is a modified FDA-approved test that has been validated and its performance characteristics determined by the reporting laboratory.  This laboratory is certified under the Clinical Laboratory Improvement Amendments CLIA as qualified to perform high complexity clinical laboratory testing.    TOBRAMYCIN Value in next row Sensitive      8 SENSITIVEThis is a modified FDA-approved test that has been validated and its performance characteristics determined by the reporting laboratory.  This laboratory is certified under the Clinical Laboratory Improvement Amendments CLIA as qualified to perform high complexity clinical laboratory testing.    CEFTAZIDIME Value in next row Sensitive      8 SENSITIVEThis is a modified FDA-approved test that has been validated and its performance characteristics determined by the reporting laboratory.  This laboratory is certified under the Clinical Laboratory Improvement Amendments CLIA as qualified to perform high complexity clinical laboratory testing.    * RARE PSEUDOMONAS AERUGINOSA   Streptococcus anginosis - MIC*    PENICILLIN Value in next row Sensitive      8 SENSITIVEThis is a modified FDA-approved test that has been validated and its performance characteristics determined by the reporting laboratory.  This laboratory is certified under the Clinical Laboratory Improvement Amendments CLIA as qualified to perform high complexity clinical laboratory testing.    CEFTRIAXONE Value in next row Sensitive      8 SENSITIVEThis is a modified FDA-approved test that has been validated  and its performance characteristics determined by the reporting laboratory.  This laboratory is certified under the Clinical Laboratory Improvement Amendments CLIA as qualified to perform high complexity clinical laboratory testing.    ERYTHROMYCIN Value in next row Sensitive      8 SENSITIVEThis is a modified FDA-approved test that has been validated and its performance characteristics determined by the reporting laboratory.  This laboratory is certified under the Clinical Laboratory Improvement Amendments CLIA as qualified to perform high complexity clinical laboratory testing.    LEVOFLOXACIN Value in next row Sensitive      8 SENSITIVEThis is a modified FDA-approved test that has been validated and its performance characteristics determined by the reporting laboratory.  This laboratory is certified under the Clinical Laboratory Improvement Amendments CLIA as qualified to perform high complexity clinical laboratory testing.    VANCOMYCIN Value in next row Sensitive      8 SENSITIVEThis is a modified FDA-approved test that has been validated and its performance characteristics determined by the reporting laboratory.  This laboratory is certified under the Clinical Laboratory Improvement Amendments CLIA as qualified to perform high complexity clinical laboratory testing.    * FEW STREPTOCOCCUS ANGINOSIS  Aerobic Culture w Gram Stain (superficial specimen)     Status: None (Preliminary result)   Collection Time: 08/18/24  9:25 AM   Specimen: Wound  Result Value Ref Range Status  Specimen Description   Final    WOUND Performed at [facility], [address] [place], [place] [address]    Special Requests   Final    LEFT LEG Performed at [facility], [address], [place], [place] [address]    Gram Stain   Final    NO WBC SEEN FEW GRAM POSITIVE COCCI FEW GRAM NEGATIVE RODS FEW GRAM POSITIVE RODS    Culture   Final    TOO YOUNG TO READ Performed at  [facility], [address], [place], [place] [address]    Report Status PENDING  Incomplete    [x]  Treated with doxycycline , organism resistant to prescribed antimicrobial  Stop doxycycline .  New antibiotic prescription:  Ciprofloxacin 500mg  PO BID x 7 days Augmentin 875/125 mg PO BID x 7 days  ED Provider: Elsie Body, MD   Kathleen Fletcher 08/19/2024, 9:01 AM Clinical Pharmacist Monday - Friday phone -  [phone] Saturday - Sunday phone - [phone]

## 2024-08-19 NOTE — Telephone Encounter (Signed)
 Post ED Visit - Positive Culture Follow-up: Unsuccessful Patient Follow-up  Culture assessed and recommendations reviewed by:  [x]  Elma Amend, Pharm.D. []  Venetia Gully, Pharm.D., BCPS AQ-ID []  Garrel Crews, Pharm.D., BCPS []  Almarie Lunger, Pharm.D., BCPS []  [place], [place].D., BCPS, AAHIVP []  Rosaline Bihari, Pharm.D., BCPS, AAHIVP []  Massie Rigg, PharmD []  Jodie Rower, PharmD, BCPS  Positive wound culture  []  Patient discharged without antimicrobial prescription and treatment is now indicated [x]  Organism is resistant to prescribed ED discharge antimicrobial []  Patient with positive blood cultures   Plan: stop Doxycycline  and start Cipro 500 mg po BID x 7 days and start Augmentin 875/125 mg po BID x 7 days per ED provider Elsie Body, MD   Unable to contact patient after 3 attempts, letter will be sent to address on file  Ruth Camelia Elbe 08/19/2024, 11:17 AM

## 2024-08-21 LAB — AEROBIC CULTURE W GRAM STAIN (SUPERFICIAL SPECIMEN): Gram Stain: NONE SEEN

## 2024-08-25 ENCOUNTER — Encounter (HOSPITAL_BASED_OUTPATIENT_CLINIC_OR_DEPARTMENT_OTHER): Payer: PRIVATE HEALTH INSURANCE | Admitting: Internal Medicine

## 2024-08-25 DIAGNOSIS — L97828 Non-pressure chronic ulcer of other part of left lower leg with other specified severity: Secondary | ICD-10-CM | POA: Diagnosis not present

## 2024-08-25 DIAGNOSIS — T798XXA Other early complications of trauma, initial encounter: Secondary | ICD-10-CM | POA: Diagnosis not present

## 2024-08-25 DIAGNOSIS — I87312 Chronic venous hypertension (idiopathic) with ulcer of left lower extremity: Secondary | ICD-10-CM | POA: Diagnosis not present

## 2024-08-25 DIAGNOSIS — T799XXA Unspecified early complication of trauma, initial encounter: Secondary | ICD-10-CM | POA: Diagnosis not present

## 2024-09-01 ENCOUNTER — Encounter (HOSPITAL_BASED_OUTPATIENT_CLINIC_OR_DEPARTMENT_OTHER): Payer: PRIVATE HEALTH INSURANCE | Admitting: Internal Medicine

## 2024-09-01 DIAGNOSIS — L97828 Non-pressure chronic ulcer of other part of left lower leg with other specified severity: Secondary | ICD-10-CM

## 2024-09-01 DIAGNOSIS — I87312 Chronic venous hypertension (idiopathic) with ulcer of left lower extremity: Secondary | ICD-10-CM

## 2024-09-01 DIAGNOSIS — T798XXA Other early complications of trauma, initial encounter: Secondary | ICD-10-CM

## 2024-09-01 DIAGNOSIS — T799XXA Unspecified early complication of trauma, initial encounter: Secondary | ICD-10-CM | POA: Diagnosis not present

## 2024-09-05 DIAGNOSIS — E559 Vitamin D deficiency, unspecified: Secondary | ICD-10-CM | POA: Diagnosis not present

## 2024-09-05 DIAGNOSIS — Z13220 Encounter for screening for lipoid disorders: Secondary | ICD-10-CM | POA: Diagnosis not present

## 2024-09-05 DIAGNOSIS — Z0000 Encounter for general adult medical examination without abnormal findings: Secondary | ICD-10-CM | POA: Diagnosis not present

## 2024-09-05 DIAGNOSIS — N390 Urinary tract infection, site not specified: Secondary | ICD-10-CM | POA: Diagnosis not present

## 2024-09-09 ENCOUNTER — Encounter (HOSPITAL_BASED_OUTPATIENT_CLINIC_OR_DEPARTMENT_OTHER): Payer: PRIVATE HEALTH INSURANCE | Attending: Internal Medicine | Admitting: Internal Medicine

## 2024-09-09 DIAGNOSIS — I87312 Chronic venous hypertension (idiopathic) with ulcer of left lower extremity: Secondary | ICD-10-CM | POA: Diagnosis not present

## 2024-09-09 DIAGNOSIS — T798XXA Other early complications of trauma, initial encounter: Secondary | ICD-10-CM | POA: Insufficient documentation

## 2024-09-09 DIAGNOSIS — X58XXXA Exposure to other specified factors, initial encounter: Secondary | ICD-10-CM | POA: Diagnosis not present

## 2024-09-09 DIAGNOSIS — L97828 Non-pressure chronic ulcer of other part of left lower leg with other specified severity: Secondary | ICD-10-CM | POA: Diagnosis present

## 2024-09-16 ENCOUNTER — Encounter (HOSPITAL_BASED_OUTPATIENT_CLINIC_OR_DEPARTMENT_OTHER): Payer: PRIVATE HEALTH INSURANCE | Admitting: Internal Medicine

## 2024-09-16 DIAGNOSIS — L97828 Non-pressure chronic ulcer of other part of left lower leg with other specified severity: Secondary | ICD-10-CM

## 2024-09-16 DIAGNOSIS — I87312 Chronic venous hypertension (idiopathic) with ulcer of left lower extremity: Secondary | ICD-10-CM | POA: Diagnosis not present

## 2024-09-16 DIAGNOSIS — T798XXA Other early complications of trauma, initial encounter: Secondary | ICD-10-CM | POA: Diagnosis not present

## 2024-09-23 ENCOUNTER — Encounter (HOSPITAL_BASED_OUTPATIENT_CLINIC_OR_DEPARTMENT_OTHER): Payer: PRIVATE HEALTH INSURANCE | Admitting: Internal Medicine

## 2024-09-23 DIAGNOSIS — I87312 Chronic venous hypertension (idiopathic) with ulcer of left lower extremity: Secondary | ICD-10-CM

## 2024-09-23 DIAGNOSIS — L97828 Non-pressure chronic ulcer of other part of left lower leg with other specified severity: Secondary | ICD-10-CM

## 2024-09-23 DIAGNOSIS — T798XXA Other early complications of trauma, initial encounter: Secondary | ICD-10-CM | POA: Diagnosis not present

## 2024-09-30 ENCOUNTER — Encounter (HOSPITAL_BASED_OUTPATIENT_CLINIC_OR_DEPARTMENT_OTHER): Payer: PRIVATE HEALTH INSURANCE | Admitting: Internal Medicine

## 2024-09-30 DIAGNOSIS — L97828 Non-pressure chronic ulcer of other part of left lower leg with other specified severity: Secondary | ICD-10-CM

## 2024-09-30 DIAGNOSIS — I87312 Chronic venous hypertension (idiopathic) with ulcer of left lower extremity: Secondary | ICD-10-CM | POA: Diagnosis not present

## 2024-09-30 DIAGNOSIS — X58XXXA Exposure to other specified factors, initial encounter: Secondary | ICD-10-CM | POA: Diagnosis not present

## 2024-09-30 DIAGNOSIS — T798XXA Other early complications of trauma, initial encounter: Secondary | ICD-10-CM

## 2024-10-07 ENCOUNTER — Encounter (HOSPITAL_BASED_OUTPATIENT_CLINIC_OR_DEPARTMENT_OTHER): Payer: PRIVATE HEALTH INSURANCE | Admitting: Internal Medicine

## 2024-10-13 ENCOUNTER — Encounter (HOSPITAL_BASED_OUTPATIENT_CLINIC_OR_DEPARTMENT_OTHER): Payer: PRIVATE HEALTH INSURANCE | Admitting: Internal Medicine

## 2024-10-25 ENCOUNTER — Encounter (HOSPITAL_BASED_OUTPATIENT_CLINIC_OR_DEPARTMENT_OTHER): Payer: PRIVATE HEALTH INSURANCE | Attending: Internal Medicine | Admitting: Internal Medicine

## 2024-10-25 DIAGNOSIS — T798XXA Other early complications of trauma, initial encounter: Secondary | ICD-10-CM | POA: Diagnosis not present

## 2024-10-25 DIAGNOSIS — I87312 Chronic venous hypertension (idiopathic) with ulcer of left lower extremity: Secondary | ICD-10-CM | POA: Diagnosis not present

## 2024-10-25 DIAGNOSIS — X58XXXA Exposure to other specified factors, initial encounter: Secondary | ICD-10-CM | POA: Insufficient documentation

## 2024-10-25 DIAGNOSIS — L97828 Non-pressure chronic ulcer of other part of left lower leg with other specified severity: Secondary | ICD-10-CM | POA: Insufficient documentation

## 2024-11-02 ENCOUNTER — Encounter (HOSPITAL_BASED_OUTPATIENT_CLINIC_OR_DEPARTMENT_OTHER): Payer: PRIVATE HEALTH INSURANCE | Admitting: Internal Medicine

## 2024-11-02 DIAGNOSIS — L97828 Non-pressure chronic ulcer of other part of left lower leg with other specified severity: Secondary | ICD-10-CM | POA: Diagnosis not present

## 2024-11-02 DIAGNOSIS — I87312 Chronic venous hypertension (idiopathic) with ulcer of left lower extremity: Secondary | ICD-10-CM

## 2024-11-02 DIAGNOSIS — T798XXA Other early complications of trauma, initial encounter: Secondary | ICD-10-CM | POA: Diagnosis not present

## 2024-11-02 DIAGNOSIS — X58XXXA Exposure to other specified factors, initial encounter: Secondary | ICD-10-CM | POA: Diagnosis not present

## 2024-11-04 DIAGNOSIS — L97828 Non-pressure chronic ulcer of other part of left lower leg with other specified severity: Secondary | ICD-10-CM | POA: Diagnosis not present

## 2024-11-08 ENCOUNTER — Encounter (HOSPITAL_BASED_OUTPATIENT_CLINIC_OR_DEPARTMENT_OTHER): Payer: PRIVATE HEALTH INSURANCE | Attending: Internal Medicine | Admitting: Internal Medicine

## 2024-11-08 DIAGNOSIS — L97828 Non-pressure chronic ulcer of other part of left lower leg with other specified severity: Secondary | ICD-10-CM | POA: Diagnosis not present

## 2024-11-08 DIAGNOSIS — I87312 Chronic venous hypertension (idiopathic) with ulcer of left lower extremity: Secondary | ICD-10-CM | POA: Insufficient documentation

## 2024-11-08 DIAGNOSIS — T798XXA Other early complications of trauma, initial encounter: Secondary | ICD-10-CM

## 2024-11-22 ENCOUNTER — Encounter (HOSPITAL_BASED_OUTPATIENT_CLINIC_OR_DEPARTMENT_OTHER): Payer: PRIVATE HEALTH INSURANCE | Admitting: Internal Medicine

## 2024-11-22 DIAGNOSIS — T798XXA Other early complications of trauma, initial encounter: Secondary | ICD-10-CM | POA: Diagnosis not present

## 2024-11-22 DIAGNOSIS — L97828 Non-pressure chronic ulcer of other part of left lower leg with other specified severity: Secondary | ICD-10-CM | POA: Diagnosis not present

## 2024-11-22 DIAGNOSIS — I87312 Chronic venous hypertension (idiopathic) with ulcer of left lower extremity: Secondary | ICD-10-CM | POA: Diagnosis not present

## 2024-12-07 ENCOUNTER — Encounter (HOSPITAL_BASED_OUTPATIENT_CLINIC_OR_DEPARTMENT_OTHER): Payer: PRIVATE HEALTH INSURANCE | Admitting: Internal Medicine

## 2024-12-07 DIAGNOSIS — L97828 Non-pressure chronic ulcer of other part of left lower leg with other specified severity: Secondary | ICD-10-CM

## 2024-12-07 DIAGNOSIS — I87312 Chronic venous hypertension (idiopathic) with ulcer of left lower extremity: Secondary | ICD-10-CM

## 2024-12-07 DIAGNOSIS — T798XXA Other early complications of trauma, initial encounter: Secondary | ICD-10-CM | POA: Diagnosis not present

## 2024-12-12 ENCOUNTER — Encounter (HOSPITAL_BASED_OUTPATIENT_CLINIC_OR_DEPARTMENT_OTHER): Payer: PRIVATE HEALTH INSURANCE | Attending: Internal Medicine | Admitting: Internal Medicine

## 2024-12-12 DIAGNOSIS — L97828 Non-pressure chronic ulcer of other part of left lower leg with other specified severity: Secondary | ICD-10-CM | POA: Diagnosis not present

## 2024-12-12 DIAGNOSIS — T798XXA Other early complications of trauma, initial encounter: Secondary | ICD-10-CM | POA: Diagnosis present

## 2024-12-12 DIAGNOSIS — I87312 Chronic venous hypertension (idiopathic) with ulcer of left lower extremity: Secondary | ICD-10-CM | POA: Diagnosis not present

## 2024-12-19 ENCOUNTER — Encounter (HOSPITAL_BASED_OUTPATIENT_CLINIC_OR_DEPARTMENT_OTHER): Payer: PRIVATE HEALTH INSURANCE | Admitting: Internal Medicine

## 2024-12-19 DIAGNOSIS — T798XXA Other early complications of trauma, initial encounter: Secondary | ICD-10-CM | POA: Diagnosis not present

## 2024-12-19 DIAGNOSIS — I87312 Chronic venous hypertension (idiopathic) with ulcer of left lower extremity: Secondary | ICD-10-CM | POA: Diagnosis not present

## 2024-12-19 DIAGNOSIS — L97828 Non-pressure chronic ulcer of other part of left lower leg with other specified severity: Secondary | ICD-10-CM | POA: Diagnosis not present

## 2024-12-26 ENCOUNTER — Encounter (HOSPITAL_BASED_OUTPATIENT_CLINIC_OR_DEPARTMENT_OTHER): Payer: PRIVATE HEALTH INSURANCE | Admitting: Internal Medicine

## 2024-12-26 DIAGNOSIS — I87312 Chronic venous hypertension (idiopathic) with ulcer of left lower extremity: Secondary | ICD-10-CM | POA: Diagnosis not present

## 2024-12-26 DIAGNOSIS — L97828 Non-pressure chronic ulcer of other part of left lower leg with other specified severity: Secondary | ICD-10-CM

## 2024-12-26 DIAGNOSIS — T798XXA Other early complications of trauma, initial encounter: Secondary | ICD-10-CM

## 2025-01-02 ENCOUNTER — Encounter (HOSPITAL_BASED_OUTPATIENT_CLINIC_OR_DEPARTMENT_OTHER): Payer: PRIVATE HEALTH INSURANCE | Admitting: Internal Medicine
